# Patient Record
Sex: Male | Born: 1935 | Race: White | Hispanic: No | Marital: Married | Smoking: Former smoker
Health system: Southern US, Community
[De-identification: ages and names within clinical notes are randomized; demographics above are authoritative.]

## PROBLEM LIST (undated history)

## (undated) DIAGNOSIS — K7460 Unspecified cirrhosis of liver: Secondary | ICD-10-CM

## (undated) DIAGNOSIS — I10 Essential (primary) hypertension: Secondary | ICD-10-CM

## (undated) DIAGNOSIS — H9190 Unspecified hearing loss, unspecified ear: Secondary | ICD-10-CM

## (undated) HISTORY — DX: Unspecified cirrhosis of liver: K74.60

## (undated) HISTORY — PX: OTHER SURGICAL HISTORY: SHX169

---

## 2015-10-23 DIAGNOSIS — K5900 Constipation, unspecified: Secondary | ICD-10-CM | POA: Diagnosis not present

## 2015-10-23 DIAGNOSIS — K7460 Unspecified cirrhosis of liver: Secondary | ICD-10-CM | POA: Diagnosis not present

## 2015-10-23 DIAGNOSIS — I81 Portal vein thrombosis: Secondary | ICD-10-CM | POA: Diagnosis not present

## 2015-10-23 DIAGNOSIS — Z87891 Personal history of nicotine dependence: Secondary | ICD-10-CM | POA: Diagnosis not present

## 2015-10-23 DIAGNOSIS — R1032 Left lower quadrant pain: Secondary | ICD-10-CM | POA: Diagnosis not present

## 2015-10-23 DIAGNOSIS — Z8489 Family history of other specified conditions: Secondary | ICD-10-CM | POA: Diagnosis not present

## 2015-10-23 DIAGNOSIS — K55069 Acute infarction of intestine, part and extent unspecified: Secondary | ICD-10-CM | POA: Diagnosis not present

## 2015-10-23 DIAGNOSIS — K766 Portal hypertension: Secondary | ICD-10-CM | POA: Diagnosis not present

## 2015-10-23 DIAGNOSIS — R319 Hematuria, unspecified: Secondary | ICD-10-CM | POA: Diagnosis not present

## 2015-10-23 DIAGNOSIS — K8020 Calculus of gallbladder without cholecystitis without obstruction: Secondary | ICD-10-CM | POA: Diagnosis not present

## 2015-10-23 DIAGNOSIS — I700 Atherosclerosis of aorta: Secondary | ICD-10-CM | POA: Diagnosis not present

## 2017-02-16 ENCOUNTER — Other Ambulatory Visit: Payer: Self-pay | Admitting: *Deleted

## 2017-02-16 NOTE — Patient Outreach (Signed)
[employer] telephone screen attempted,unable to leave a message I will call another day.  Eulah Pont. Myrtie Neither, MSN, [facility] Gerontological Nurse Practitioner [facility] Care Management [phone]

## 2017-02-21 ENCOUNTER — Other Ambulatory Visit: Payer: Self-pay | Admitting: *Deleted

## 2017-02-21 NOTE — Patient Outreach (Signed)
[employer] HRA #2 attempted. I spoke with pt's son who advised me his father travels a lot and I may be able to reach him tomorrow. He also tells me he was in [facility] hospital about a year ago and he thinks his father sees Dr. Karie Kirks.  I will call him in the morning.  Bobby Tyler. Myrtie Neither, MSN, [facility] Gerontological Nurse Practitioner [facility] Care Management [phone]

## 2017-02-22 ENCOUNTER — Other Ambulatory Visit: Payer: Self-pay | Admitting: *Deleted

## 2017-02-22 ENCOUNTER — Encounter: Payer: Self-pay | Admitting: *Deleted

## 2017-02-22 NOTE — Patient Outreach (Signed)
[employer] HRA completed. Mr. Perko is a very active, independent gentleman that does not see a doctor routinely. He only takes a baby ASA a day. He denies having any health problems although he reports having a blood clot in his abdomen which required hospitalization. He continues to work, driving cars for delivery.  I advised him of [facility] management services and that [facility] would like to ensure that he is getting the recommended medical services that Medicare provides. I encouraged him to schedule an appt with Dr. Dennard Schaumann to do so. I offered that I could provided an AWV to him in his home if he didn't want to schedule an OV but he said he would see Dr. Dennard Schaumann.  He has no care management needs at this time. I will send him our information for future reference.  Eulah Pont. Myrtie Neither, MSN, [facility] Gerontological Nurse Practitioner [facility] Care Management [phone]

## 2017-02-23 NOTE — Telephone Encounter (Signed)
This encounter was created in error - please disregard.

## 2017-08-23 DIAGNOSIS — K7460 Unspecified cirrhosis of liver: Secondary | ICD-10-CM

## 2017-08-23 HISTORY — DX: Unspecified cirrhosis of liver: K74.60

## 2017-09-08 ENCOUNTER — Emergency Department (HOSPITAL_COMMUNITY): Payer: Medicare HMO

## 2017-09-08 ENCOUNTER — Emergency Department (HOSPITAL_COMMUNITY)
Admission: EM | Admit: 2017-09-08 | Discharge: 2017-09-08 | Disposition: A | Discharge status: Home / Self Care | Payer: Medicare HMO | Attending: Emergency Medicine | Admitting: Emergency Medicine

## 2017-09-08 ENCOUNTER — Encounter (HOSPITAL_COMMUNITY): Payer: Self-pay | Admitting: Emergency Medicine

## 2017-09-08 ENCOUNTER — Other Ambulatory Visit: Payer: Self-pay

## 2017-09-08 DIAGNOSIS — R600 Localized edema: Secondary | ICD-10-CM | POA: Diagnosis not present

## 2017-09-08 DIAGNOSIS — K4090 Unilateral inguinal hernia, without obstruction or gangrene, not specified as recurrent: Secondary | ICD-10-CM | POA: Insufficient documentation

## 2017-09-08 DIAGNOSIS — R918 Other nonspecific abnormal finding of lung field: Secondary | ICD-10-CM | POA: Diagnosis not present

## 2017-09-08 DIAGNOSIS — Z87891 Personal history of nicotine dependence: Secondary | ICD-10-CM | POA: Insufficient documentation

## 2017-09-08 DIAGNOSIS — R188 Other ascites: Secondary | ICD-10-CM

## 2017-09-08 DIAGNOSIS — K7460 Unspecified cirrhosis of liver: Secondary | ICD-10-CM | POA: Diagnosis not present

## 2017-09-08 DIAGNOSIS — K8080 Other cholelithiasis without obstruction: Secondary | ICD-10-CM

## 2017-09-08 DIAGNOSIS — R2243 Localized swelling, mass and lump, lower limb, bilateral: Secondary | ICD-10-CM | POA: Diagnosis present

## 2017-09-08 DIAGNOSIS — K8020 Calculus of gallbladder without cholecystitis without obstruction: Secondary | ICD-10-CM | POA: Insufficient documentation

## 2017-09-08 DIAGNOSIS — R1900 Intra-abdominal and pelvic swelling, mass and lump, unspecified site: Secondary | ICD-10-CM | POA: Diagnosis not present

## 2017-09-08 LAB — URINALYSIS, ROUTINE W REFLEX MICROSCOPIC
Bacteria, UA: NONE SEEN
Bilirubin Urine: NEGATIVE
GLUCOSE, UA: NEGATIVE mg/dL
Ketones, ur: NEGATIVE mg/dL
Nitrite: NEGATIVE
PROTEIN: NEGATIVE mg/dL
Specific Gravity, Urine: 1.015 (ref 1.005–1.030)
pH: 5 (ref 5.0–8.0)

## 2017-09-08 LAB — CBC WITH DIFFERENTIAL/PLATELET
BASOS ABS: 0 10*3/uL (ref 0.0–0.1)
BASOS PCT: 1 %
EOS ABS: 0.4 10*3/uL (ref 0.0–0.7)
Eosinophils Relative: 5 %
HCT: 36.4 % — ABNORMAL LOW (ref 39.0–52.0)
HEMOGLOBIN: 12.5 g/dL — AB (ref 13.0–17.0)
Lymphocytes Relative: 29 %
Lymphs Abs: 1.9 10*3/uL (ref 0.7–4.0)
MCH: 32.7 pg (ref 26.0–34.0)
MCHC: 34.3 g/dL (ref 30.0–36.0)
MCV: 95.3 fL (ref 78.0–100.0)
Monocytes Absolute: 0.7 10*3/uL (ref 0.1–1.0)
Monocytes Relative: 11 %
NEUTROS PCT: 54 %
Neutro Abs: 3.6 10*3/uL (ref 1.7–7.7)
Platelets: 115 10*3/uL — ABNORMAL LOW (ref 150–400)
RBC: 3.82 MIL/uL — AB (ref 4.22–5.81)
RDW: 15.4 % (ref 11.5–15.5)
WBC: 6.5 10*3/uL (ref 4.0–10.5)

## 2017-09-08 LAB — COMPREHENSIVE METABOLIC PANEL
ALK PHOS: 83 U/L (ref 38–126)
ALT: 27 U/L (ref 17–63)
AST: 45 U/L — AB (ref 15–41)
Albumin: 2.8 g/dL — ABNORMAL LOW (ref 3.5–5.0)
Anion gap: 5 (ref 5–15)
BILIRUBIN TOTAL: 2.4 mg/dL — AB (ref 0.3–1.2)
BUN: 13 mg/dL (ref 6–20)
CALCIUM: 8.2 mg/dL — AB (ref 8.9–10.3)
CO2: 28 mmol/L (ref 22–32)
Chloride: 104 mmol/L (ref 101–111)
Creatinine, Ser: 1.09 mg/dL (ref 0.61–1.24)
Glucose, Bld: 110 mg/dL — ABNORMAL HIGH (ref 65–99)
Potassium: 4.3 mmol/L (ref 3.5–5.1)
Sodium: 137 mmol/L (ref 135–145)
Total Protein: 6.4 g/dL — ABNORMAL LOW (ref 6.5–8.1)

## 2017-09-08 LAB — BRAIN NATRIURETIC PEPTIDE: B Natriuretic Peptide: 129 pg/mL — ABNORMAL HIGH (ref 0.0–100.0)

## 2017-09-08 LAB — TROPONIN I

## 2017-09-08 MED ORDER — IOPAMIDOL (ISOVUE-370) INJECTION 76%
100.0000 mL | Freq: Once | INTRAVENOUS | Status: AC | PRN
  Administered 2017-09-08: 100 mL via INTRAVENOUS

## 2017-09-08 MED ORDER — SPIRONOLACTONE 25 MG PO TABS: 25.0000 mg | ORAL_TABLET | Freq: Two times a day (BID) | ORAL | 0 refills | Status: DC

## 2017-09-08 NOTE — ED Provider Notes (Signed)
[facility] EMERGENCY DEPARTMENT Provider Note   CSN: [id number] Arrival date & time: 09/08/17  1221     History   Chief Complaint Chief Complaint  Patient presents with  . Hernia    HPI Bobby Tyler is a 82 y.o. male.  HPI   He is here for evaluation of swelling in the legs present for about a week, and concern for an abdominal umbilical hernia present for many years.  He denies shortness of breath, fever, chills, nausea, vomiting or diarrhea.  He has had decreased appetite in the last several weeks.  He denies dizziness or weakness.  He states he was previously treated with an unknown anticoagulant for "blood clot in my abdomen," 1 year ago.  He did not continue the anticoagulant because it was too expensive and he started taking aspirin instead.  He is currently taking one baby aspirin each day.  There are no other known modifying factors.  History reviewed. No pertinent past medical history.  There are no active problems to display for this patient.   History reviewed. No pertinent surgical history.      Home Medications    Prior to Admission medications   Medication Sig Start Date End Date Taking? Authorizing Provider  spironolactone (ALDACTONE) 25 MG tablet Take 1 tablet (25 mg total) by mouth 2 (two) times daily. 09/08/17   Daleen Bo, MD    Family History No family history on file.  Social History Social History   Tobacco Use  . Smoking status: Former Smoker    Types: Cigarettes    Last attempt to quit: 1985    Years since quitting: 34.3  . Smokeless tobacco: Never Used  Substance Use Topics  . Alcohol use: Not Currently  . Drug use: Not Currently     Allergies   Patient has no known allergies.   Review of Systems Review of Systems  All other systems reviewed and are negative.    Physical Exam Updated Vital Signs BP (!) 163/74   Pulse (!) 49   Temp 98.5 F (36.9 C) (Oral)   Resp 16   Ht 5' 10"  (1.778 m)   Wt 97.5 kg (215 lb)    SpO2 98%   BMI 30.85 kg/m   Physical Exam  Constitutional: He is oriented to person, place, and time. He appears well-developed. No distress.  Elderly, overweight  HENT:  Head: Normocephalic and atraumatic.  Right Ear: External ear normal.  Left Ear: External ear normal.  Eyes: Pupils are equal, round, and reactive to light. Conjunctivae and EOM are normal.  Neck: Normal range of motion and phonation normal. Neck supple.  Cardiovascular: Normal rate, regular rhythm and normal heart sounds.  Pulmonary/Chest: Effort normal and breath sounds normal. No stridor. No respiratory distress. He has no wheezes. He exhibits no bony tenderness.  Abdominal: Soft. He exhibits no distension and no mass. There is no tenderness. There is no guarding.  Small easily reducible umbilical hernia.  No associated skin changes or tenderness.  Musculoskeletal: Normal range of motion. He exhibits edema (3+ lower extremity edema, extending to thighs bilaterally.).  Neurological: He is alert and oriented to person, place, and time. No cranial nerve deficit or sensory deficit. He exhibits normal muscle tone. Coordination normal.  Skin: Skin is warm, dry and intact. No rash noted. No erythema.  Psychiatric: He has a normal mood and affect. His behavior is normal. Judgment and thought content normal.  Nursing note and vitals reviewed.    ED Treatments /  Results  Labs (all labs ordered are listed, but only abnormal results are displayed) Labs Reviewed  COMPREHENSIVE METABOLIC PANEL - Abnormal; Notable for the following components:      Result Value   Glucose, Bld 110 (*)    Calcium 8.2 (*)    Total Protein 6.4 (*)    Albumin 2.8 (*)    AST 45 (*)    Total Bilirubin 2.4 (*)    All other components within normal limits  BRAIN NATRIURETIC PEPTIDE - Abnormal; Notable for the following components:   B Natriuretic Peptide 129.0 (*)    All other components within normal limits  CBC WITH DIFFERENTIAL/PLATELET -  Abnormal; Notable for the following components:   RBC 3.82 (*)    Hemoglobin 12.5 (*)    HCT 36.4 (*)    Platelets 115 (*)    All other components within normal limits  URINALYSIS, ROUTINE W REFLEX MICROSCOPIC - Abnormal; Notable for the following components:   Hgb urine dipstick SMALL (*)    Leukocytes, UA TRACE (*)    All other components within normal limits  TROPONIN I    EKG EKG Interpretation  Date/Time:  Friday Sep 08 2017 13:32:44 EDT Ventricular Rate:  55 PR Interval:    QRS Duration: 107 QT Interval:  437 QTC Calculation: 418 R Axis:   59 Text Interpretation:  Sinus rhythm Abnormal inferior Q waves Baseline wander in lead(s) V1 V2 No old tracing to compare Confirmed by Daleen Bo [phone]) on 09/08/2017 1:38:33 PM   Radiology Dg Chest 2 View  Result Date: 09/08/2017 CLINICAL DATA:  Abdominal and lower extremity swelling. EXAM: CHEST - 2 VIEW COMPARISON:  None. FINDINGS: UPPER limits normal heart size noted. Interstitial opacities bilaterally are of uncertain chronicity. There is no evidence of focal airspace disease, suspicious pulmonary nodule/mass, pleural effusion, or pneumothorax. No acute bony abnormalities are identified. IMPRESSION: Nonspecific bilateral interstitial opacities-suspect chronic. UPPER limits normal heart size. Electronically Signed   By: Margarette Canada M.D.   On: 09/08/2017 14:52   Ct Angio Abd/pel W And/or Wo Contrast  Result Date: 09/08/2017 CLINICAL DATA:  Bilateral leg swelling. Abdominal pain. Rule out ischemia. EXAM: CTA ABDOMEN AND PELVIS without AND WITH CONTRAST TECHNIQUE: Multidetector CT imaging of the abdomen and pelvis was performed using the standard protocol during bolus administration of intravenous contrast. Multiplanar reconstructed images and MIPs were obtained and reviewed to evaluate the vascular anatomy. CONTRAST:  18m ISOVUE-370 IOPAMIDOL (ISOVUE-370) INJECTION 76% COMPARISON:  CT abdomen pelvis 10/23/2015. Report only available  this time FINDINGS: VASCULAR Aorta: Mild atherosclerotic disease in the aorta without aneurysm. Mild atherosclerotic disease in the iliac arteries bilaterally which are widely patent. Celiac: Moderate to severe stenosis at the origin SMA: Mild stenosis at the origin of the SMA with diffuse atherosclerotic calcification Renals: Single renal artery bilaterally widely patent with mild atherosclerotic disease. IMA: Patent Inflow: Normal Proximal Outflow: Normal Veins: Cirrhosis of the liver. Portal vein is patent. Superior mesenteric vein is patent. Renal veins are patent bilaterally. Mild paraesophageal varices. Review of the MIP images confirms the above findings. NON-VASCULAR Lower chest: Negative for infiltrate or effusion Hepatobiliary: Advanced cirrhosis of liver with small liver with nodular contour. Left lobe is hypertrophied. Large amount of ascites throughout the abdomen and pelvis Pancreas: Negative Spleen: Normal splenic size Adrenals/Urinary Tract: Negative for renal mass or obstruction. No renal calculi. Urinary bladder normal. Stomach/Bowel: Negative for bowel obstruction. No bowel edema or mass. Lymphatic: Negative for lymphadenopathy Reproductive: Mild prostate enlargement Other: Left inguinal  hernia containing a loop of sigmoid colon and fluid. Negative for bowel edema in the hernia sac. Musculoskeletal: No acute skeletal abnormality. 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 No evidence of bowel ischemia. Negative for venous thrombosis. Mild atherosclerotic disease in the aorta and iliac arteries without significant stenosis NON-VASCULAR *Advanced cirrhosis with large amount of ascites. *Left inguinal hernia containing sigmoid colon without evidence of bowel edema. *Cholelithiasis Electronically Signed   By: Franchot Gallo M.D.   On: 09/08/2017 18:53    Procedures Procedures (including critical care time)  Medications Ordered in  ED Medications  iopamidol (ISOVUE-370) 76 % injection 100 mL (100 mLs Intravenous Contrast Given 09/08/17 1636)     Initial Impression / Assessment and Plan / ED Course  I have reviewed the triage vital signs and the nursing notes.  Pertinent labs & imaging results that were available during my care of the patient were reviewed by me and considered in my medical decision making (see chart for details).  Clinical Course as of Sep 08 2017  Fri Sep 08, 2017  1449 Information obtained from [facility] admission, July 2017.  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  He was treated and discharged on Eliquis.  He was referred to outpatient follow-up but, apparently did not.   [EW]  1450 DG Chest 2 View [EW]  5456 DG Chest 2 View [EW]  2563 Normal except elevated hemoglobin, red cells  Urinalysis, Routine w reflex microscopic(!) [EW]  1753 Normal  Troponin I [EW]  1753 Normal except elevated glucose, low calcium, low protein, low albumin, and elevated AST.  Comprehensive metabolic panel(!) [EW]  8937 Mild elevation  Brain natriuretic peptide(!) [EW]  1753 Normal except hemoglobin low 12.5  CBC with Differential(!) [EW]  1754 Nonspecific interstitial opacities, images reviewed by me.  DG Chest 2 View [EW]  [id number] Patient with vascular disease, but no evidence for acute bowel ischemia.  Cirrhosis present, with marked ascites, likely source of peripheral edema.  Additional nonvascular abnormalities include left inguinal hernia without bowel strangulation and cholelithiasis.  Images reviewed by me.  CT Angio Abd/Pel W and/or Wo Contrast [EW]    Clinical Course User Index [EW] Daleen Bo, MD     Patient Vitals for the past 24 hrs:  BP Temp Temp src Pulse Resp SpO2 Height Weight  09/08/17 1900 (!) 163/74 - - (!) 49 16 98 % - -  09/08/17 1830 (!) 149/69 - - (!) 48 15 97 % - -  09/08/17 1800 (!) 182/72 - - (!) 50 15  99 % - -  09/08/17 1530 (!) 149/70 - - (!) 54 18 98 % - -  09/08/17 1500 (!) 149/71 - - (!) 47 17 99 % - -  09/08/17 1430 (!) 155/90 - - (!) 56 17 98 % - -  09/08/17 1400 136/60 - - (!) 52 17 97 % - -  09/08/17 1241 (!) 178/79 98.5 F (36.9 C) Oral 63 18 98 % - -  09/08/17 1239 - - - - - - 5' 10"  (1.778 m) 97.5 kg (215 lb)    7:05 PM Reevaluation with update and discussion. After initial assessment and treatment, an updated evaluation reveals he is comfortable has no complaints or distress.  Findings discussed with patient.  All questions answered. Daleen Bo   Medical Decision Making: Nonspecific peripheral edema, likely chronic, without evidence for significant acute  congestive heart failure.  Nonspecific pulmonary infiltrates are present.  CRITICAL CARE- no Performed by: Daleen Bo    Final Clinical Impressions(s) / ED Diagnoses   Final diagnoses:  Cirrhosis of liver without ascites, unspecified hepatic cirrhosis type ([place])  Other ascites  Biliary calculus of other site without obstruction  Left inguinal hernia    ED Discharge Orders        Ordered    spironolactone (ALDACTONE) 25 MG tablet  2 times daily     09/08/17 2016       Daleen Bo, MD 09/08/17 2019

## 2017-09-08 NOTE — ED Triage Notes (Signed)
Bilateral lower legs swollen and possible umbilical hernia G2-5 weeks

## 2017-09-08 NOTE — Discharge Instructions (Addendum)
Swelling your abdomen, and legs appears to be from liver cirrhosis.  We are prescribing a fluid pill, to help with the swelling.  Stay on a low-salt diet if possible.  You can try to increase the protein in your diet.  It is important to follow-up with the stomach specialist (gastroenterologist) as soon as possible for further evaluation and treatment.  Additional findings on the CAT scan today showed that you have gallstones, a left inguinal hernia, and some vascular disease without bowel compromise at this time.  All of these may require evaluation and treatment at a later date.  Consider getting a primary care doctor to see for evaluation and help with further treatments.  You can always return here if needed for problems.

## 2017-09-12 ENCOUNTER — Telehealth: Payer: Self-pay

## 2017-09-12 ENCOUNTER — Telehealth: Payer: Self-pay | Admitting: Gastroenterology

## 2017-09-12 NOTE — Telephone Encounter (Signed)
PT called and said he was seen in the ED recently for belly swelling and Dr. Eulis Foster told him to call here and schedule an appt with Dr. Oneida Alar office.  Pt has never been seen here before. Per Manuela Schwartz, I told him she will forward the ED notes to Roseanne Kaufman, NP to sign off on to schedule an appt.  Pt is aware he will be contacted for an appt after that process.

## 2017-09-12 NOTE — Telephone Encounter (Signed)
Yes. He needs an urgent.

## 2017-09-12 NOTE — Telephone Encounter (Signed)
Pt called to say that he was seen in the ER and was referred to [place]. Can we accept him as a new patient?

## 2017-09-12 NOTE — Telephone Encounter (Signed)
Yes, needs urgent. See other phone note.

## 2017-09-13 ENCOUNTER — Ambulatory Visit: Payer: Medicare HMO | Admitting: Gastroenterology

## 2017-09-13 ENCOUNTER — Encounter: Payer: Self-pay | Admitting: Gastroenterology

## 2017-09-13 VITALS — BP 147/79 | HR 83 | Temp 97.8°F | Ht 70.0 in | Wt 220.6 lb

## 2017-09-13 DIAGNOSIS — K7460 Unspecified cirrhosis of liver: Secondary | ICD-10-CM

## 2017-09-13 DIAGNOSIS — R601 Generalized edema: Secondary | ICD-10-CM

## 2017-09-13 DIAGNOSIS — R188 Other ascites: Secondary | ICD-10-CM

## 2017-09-13 MED ORDER — FUROSEMIDE 20 MG PO TABS: 20.0000 mg | ORAL_TABLET | Freq: Every day | ORAL | 1 refills | Status: DC

## 2017-09-13 NOTE — Telephone Encounter (Signed)
Pt is aware of OV today with [facility] at 130

## 2017-09-13 NOTE — Telephone Encounter (Signed)
PT has appt today at 1:30 pm.

## 2017-09-13 NOTE — Progress Notes (Signed)
Primary Care Physician:  Patient, No Pcp Per  Primary Gastroenterologist:  Garfield Cornea, MD   Chief Complaint  Patient presents with  . other    cirrhosis    HPI:  Bobby Tyler is a 82 y.o. male here at the recommendation of the ED physician for further evaluation of cirrhosis and edema.  Patient complains of several week history of abdominal swelling, lower extremity edema, poor appetite.  Denies significant abdominal pain.  He noted an umbilical hernia and got concerned.  That was actually the reason he went to the emergency department.  Generally having bowel movement a couple times per day.  2 days ago his stools are loose.  No melena or rectal bleeding.  No vomiting.  Denies any difficulty swallowing.  He does not go to the doctor.  He has no PCP.  He weighed 215 pounds in the emergency department on May 17.  At our office visit he weighed 220 pounds.  Patient gives a history of having a blood clot in his abdomen a couple of years ago.  Initially he was placed on Xarelto.  He took himself off due to significant bruising and took an aspirin up until a few weeks ago.  His son suggested aspirin may be causing his swelling therefore he stopped it.  While in the emergency department he had a CTA abdomen and pelvis which showed moderate to severe stenosis of the celiac artery, mild stenosis at the origin of SMA with diffuse atherosclerotic calcification, IMA patent.  Cirrhosis of the liver, portal vein patent.  Superior mesenteric vein patent.  Mild paraesophageal varices.  Negative for infiltrate or effusion of the lower chest.  Large amount of ascites noted throughout the abdomen and pelvis.  Left inguinal hernia containing a loop of sigmoid colon and fluid.  Cholelithiasis.  Labs showed total bilirubin of 2.4, albumin 2.8, AST 45, ALT 27, alkaline phosphatase 83, BUN 13, creatinine 1.09.  BNP 129.  Hemoglobin 12.5, platelets 115,000.  He was started on Aldactone 25 mg twice daily 5 days  ago.  Current Outpatient Medications  Medication Sig Dispense Refill  . spironolactone (ALDACTONE) 25 MG tablet Take 1 tablet (25 mg total) by mouth 2 (two) times daily. 60 tablet 0   No current facility-administered medications for this visit.     Allergies as of 09/13/2017  . (No Known Allergies)    Past Medical History:  Diagnosis Date  . Cirrhosis ([place]) 08/2017    Past Surgical History:  Procedure Laterality Date  . none      Family History  Problem Relation Age of Onset  . Liver cancer Neg Hx   . Cirrhosis Neg Hx   . Colon cancer Neg Hx     Social History   Socioeconomic History  . Marital status: Married    Spouse name: Not on file  . Number of children: Not on file  . Years of education: Not on file  . Highest education level: Not on file  Occupational History  . Not on file  Social Needs  . Financial resource strain: Not on file  . Food insecurity:    Worry: Not on file    Inability: Not on file  . Transportation needs:    Medical: Not on file    Non-medical: Not on file  Tobacco Use  . Smoking status: Former Smoker    Types: Cigarettes    Last attempt to quit: 1985    Years since quitting: 34.4  . Smokeless tobacco:  Never Used  Substance and Sexual Activity  . Alcohol use: Not Currently    Comment: never used regularly/heavily  . Drug use: Not Currently  . Sexual activity: Not on file  Lifestyle  . Physical activity:    Days per week: Not on file    Minutes per session: Not on file  . Stress: Not on file  Relationships  . Social connections:    Talks on phone: Not on file    Gets together: Not on file    Attends religious service: Not on file    Active member of club or organization: Not on file    Attends meetings of clubs or organizations: Not on file    Relationship status: Not on file  . Intimate partner violence:    Fear of current or ex partner: Not on file    Emotionally abused: Not on file    Physically abused: Not on file     Forced sexual activity: Not on file  Other Topics Concern  . Not on file  Social History Narrative  . Not on file      ROS:  General: Negative for anorexia, weight loss, fever, chills, fatigue, weakness. Eyes: Negative for vision changes.  ENT: Negative for hoarseness, difficulty swallowing , nasal congestion. CV: Negative for chest pain, angina, palpitations, dyspnea on exertion, +++peripheral edema.  Respiratory: Negative for dyspnea at rest, dyspnea on exertion, cough, sputum, wheezing.  GI: See history of present illness. GU:  Negative for dysuria, hematuria, urinary incontinence, urinary frequency, nocturnal urination. +urinary hesitancy  MS: Negative for joint pain, low back pain.  Derm: Negative for rash or itching.  Neuro: Negative for weakness, abnormal sensation, seizure, frequent headaches, memory loss, confusion.  Psych: Negative for anxiety, depression, suicidal ideation, hallucinations.  Endo: Negative for unusual weight change.  Heme: Negative for bruising or bleeding. Allergy: Negative for rash or hives.    Physical Examination:  BP (!) 147/79   Pulse 83   Temp 97.8 F (36.6 C) (Oral)   Ht 5' 10"  (1.778 m)   Wt 220 lb 9.6 oz (100.1 kg)   BMI 31.65 kg/m    General: Well-nourished, well-developed in no acute distress. Accompanied by wife.  Head: Normocephalic, atraumatic.   Eyes: Conjunctiva pink, no icterus. Mouth: Oropharyngeal mucosa moist and pink , no lesions erythema or exudate. Neck: Supple without thyromegaly, masses, or lymphadenopathy.  Lungs: Clear to auscultation bilaterally.  Heart: Regular rate and rhythm, no murmurs rubs or gallops.  Abdomen: Bowel sounds are normal, distended but not tense. +umb hernia with thin overlying skin, easily reducible and nontender, no hepatosplenomegaly or masses, no abdominal bruits, no rebound or guarding.   Rectal: not performed Extremities: 3+ lower ext edema to knees and 1+ to thighs bilaterally. No clubbing  or deformities.  Neuro: Alert and oriented x 4 , grossly normal neurologically.  Skin: Warm and dry, no rash or jaundice.   Psych: Alert and cooperative, normal mood and affect.  Labs: Lab Results  Component Value Date   WBC 6.5 09/08/2017   HGB 12.5 (L) 09/08/2017   HCT 36.4 (L) 09/08/2017   MCV 95.3 09/08/2017   PLT 115 (L) 09/08/2017   Lab Results  Component Value Date   CREATININE 1.09 09/08/2017   BUN 13 09/08/2017   NA 137 09/08/2017   K 4.3 09/08/2017   CL 104 09/08/2017   CO2 28 09/08/2017   Lab Results  Component Value Date   ALT 27 09/08/2017   AST 45 (  H) 09/08/2017   ALKPHOS 83 09/08/2017   BILITOT 2.4 (H) 09/08/2017   BNP 129 on 09/08/17.  Imaging Studies: Dg Chest 2 View  Result Date: 09/08/2017 CLINICAL DATA:  Abdominal and lower extremity swelling. EXAM: CHEST - 2 VIEW COMPARISON:  None. FINDINGS: UPPER limits normal heart size noted. Interstitial opacities bilaterally are of uncertain chronicity. There is no evidence of focal airspace disease, suspicious pulmonary nodule/mass, pleural effusion, or pneumothorax. No acute bony abnormalities are identified. IMPRESSION: Nonspecific bilateral interstitial opacities-suspect chronic. UPPER limits normal heart size. Electronically Signed   By: Margarette Canada M.D.   On: 09/08/2017 14:52   Ct Angio Abd/pel W And/or Wo Contrast  Result Date: 09/08/2017 CLINICAL DATA:  Bilateral leg swelling. Abdominal pain. Rule out ischemia. EXAM: CTA ABDOMEN AND PELVIS without AND WITH CONTRAST TECHNIQUE: Multidetector CT imaging of the abdomen and pelvis was performed using the standard protocol during bolus administration of intravenous contrast. Multiplanar reconstructed images and MIPs were obtained and reviewed to evaluate the vascular anatomy. CONTRAST:  191m ISOVUE-370 IOPAMIDOL (ISOVUE-370) INJECTION 76% COMPARISON:  CT abdomen pelvis 10/23/2015. Report only available this time FINDINGS: VASCULAR Aorta: Mild atherosclerotic disease  in the aorta without aneurysm. Mild atherosclerotic disease in the iliac arteries bilaterally which are widely patent. Celiac: Moderate to severe stenosis at the origin SMA: Mild stenosis at the origin of the SMA with diffuse atherosclerotic calcification Renals: Single renal artery bilaterally widely patent with mild atherosclerotic disease. IMA: Patent Inflow: Normal Proximal Outflow: Normal Veins: Cirrhosis of the liver. Portal vein is patent. Superior mesenteric vein is patent. Renal veins are patent bilaterally. Mild paraesophageal varices. Review of the MIP images confirms the above findings. NON-VASCULAR Lower chest: Negative for infiltrate or effusion Hepatobiliary: Advanced cirrhosis of liver with small liver with nodular contour. Left lobe is hypertrophied. Large amount of ascites throughout the abdomen and pelvis Pancreas: Negative Spleen: Normal splenic size Adrenals/Urinary Tract: Negative for renal mass or obstruction. No renal calculi. Urinary bladder normal. Stomach/Bowel: Negative for bowel obstruction. No bowel edema or mass. Lymphatic: Negative for lymphadenopathy Reproductive: Mild prostate enlargement Other: Left inguinal hernia containing a loop of sigmoid colon and fluid. Negative for bowel edema in the hernia sac. Musculoskeletal: No acute skeletal abnormality. 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 No evidence of bowel ischemia. Negative for venous thrombosis. Mild atherosclerotic disease in the aorta and iliac arteries without significant stenosis NON-VASCULAR *Advanced cirrhosis with large amount of ascites. *Left inguinal hernia containing sigmoid colon without evidence of bowel edema. *Cholelithiasis Electronically Signed   By: CFranchot GalloM.D.   On: 09/08/2017 18:53

## 2017-09-13 NOTE — Patient Instructions (Addendum)
1. Continue spironolactone, you can take both tablets at the same time every morning.  Call when you need a refill. 2. Start Lasix 20 mg every morning. 3. We will check your labs in 2 weeks to make sure you are tolerating the fluid pills.  Also check other labs to determine the cause of your cirrhosis. 4. Please weigh yourself every morning, if you are not seeing a drop in your weight and improvement in your leg swelling, you need to call me. 5. We will have you come back in the office in about 3 weeks to follow-up and to make arrangements for upper endoscopy to screen for esophageal varices. 6. Limit sodium (salt) intake to 2 g (2000 mg) daily.    Two Gram Sodium Diet 2000 mg  What is Sodium? Sodium is a mineral found naturally in many foods. The most significant source of sodium in the diet is table salt, which is about 40% sodium.  Processed, convenience, and preserved foods also contain a large amount of sodium.  The body needs only 500 mg of sodium daily to function,  A normal diet provides more than enough sodium even if you do not use salt.  Why Limit Sodium? A build up of sodium in the body can cause thirst, increased blood pressure, shortness of breath, and water retention.  Decreasing sodium in the diet can reduce edema and risk of heart attack or stroke associated with high blood pressure.  Keep in mind that there are many other factors involved in these health problems.  Heredity, obesity, lack of exercise, cigarette smoking, stress and what you eat all play a role.  General Guidelines:  Do not add salt at the table or in cooking.  One teaspoon of salt contains over 2 grams of sodium.  Read food labels  Avoid processed and convenience foods  Ask your dietitian before eating any foods not dicussed in the menu planning guidelines  Consult your physician if you wish to use a salt substitute or a sodium containing medication such as antacids.  Limit milk and milk products to 16 oz (2  cups) per day.  Shopping Hints:  READ LABELS!! "Dietetic" does not necessarily mean low sodium.  Salt and other sodium ingredients are often added to foods during processing.   Menu Planning Guidelines Food Group Choose More Often Avoid  Beverages (see also the milk group All fruit juices, low-sodium, salt-free vegetables juices, low-sodium carbonated beverages Regular vegetable or tomato juices, commercially softened water used for drinking or cooking  Breads and Cereals Enriched white, wheat, rye and pumpernickel bread, hard rolls and dinner rolls; muffins, cornbread and waffles; most dry cereals, cooked cereal without added salt; unsalted crackers and breadsticks; low sodium or homemade bread crumbs Bread, rolls and crackers with salted tops; quick breads; instant hot cereals; pancakes; commercial bread stuffing; self-rising flower and biscuit mixes; regular bread crumbs or cracker crumbs  Desserts and Sweets Desserts and sweets mad with mild should be within allowance Instant pudding mixes and cake mixes  Fats Butter or margarine; vegetable oils; unsalted salad dressings, regular salad dressings limited to 1 Tbs; light, sour and heavy cream Regular salad dressings containing bacon fat, bacon bits, and salt pork; snack dips made with instant soup mixes or processed cheese; salted nuts  Fruits Most fresh, frozen and canned fruits Fruits processed with salt or sodium-containing ingredient (some dried fruits are processed with sodium sulfites        Vegetables Fresh, frozen vegetables and low- sodium canned  vegetables Regular canned vegetables, sauerkraut, pickled vegetables, and others prepared in brine; frozen vegetables in sauces; vegetables seasoned with ham, bacon or salt pork  Condiments, Sauces, Miscellaneous  Salt substitute with physician's approval; pepper, herbs, spices; vinegar, lemon or lime juice; hot pepper sauce; garlic powder, onion powder, low sodium soy sauce (1 Tbs.); low  sodium condiments (ketchup, chili sauce, mustard) in limited amounts (1 tsp.) fresh ground horseradish; unsalted tortilla chips, pretzels, potato chips, popcorn, salsa (1/4 cup) Any seasoning made with salt including garlic salt, celery salt, onion salt, and seasoned salt; sea salt, rock salt, kosher salt; meat tenderizers; monosodium glutamate; mustard, regular soy sauce, barbecue, sauce, chili sauce, teriyaki sauce, steak sauce, Worcestershire sauce, and most flavored vinegars; canned gravy and mixes; regular condiments; salted snack foods, olives, picles, relish, horseradish sauce, catsup   Food preparation: Try these seasonings Meats:    Pork Sage, onion Serve with applesauce  Chicken Poultry seasoning, thyme, parsley Serve with cranberry sauce  Lamb Curry powder, rosemary, garlic, thyme Serve with mint sauce or jelly  Veal Marjoram, basil Serve with current jelly, cranberry sauce  Beef Pepper, bay leaf Serve with dry mustard, unsalted chive butter  Fish Bay leaf, dill Serve with unsalted lemon butter, unsalted parsley butter  Vegetables:    Asparagus Lemon juice   Broccoli Lemon juice   Carrots Mustard dressing parsley, mint, nutmeg, glazed with unsalted butter and sugar   Green beans Marjoram, lemon juice, nutmeg,dill seed   Tomatoes Basil, marjoram, onion   Spice /blend for [employer]" 4 tsp ground thyme 1 tsp ground sage 3 tsp ground rosemary 4 tsp ground marjoram   Test your knowledge 1. A product that says "Salt Free" may still contain sodium. True or False 2. Garlic Powder and Hot Pepper Sauce an be used as alternative seasonings.True or False 3. Processed foods have more sodium than fresh foods.  True or False 4. Canned Vegetables have less sodium than froze True or False  WAYS TO DECREASE YOUR SODIUM INTAKE 1. Avoid the use of added salt in cooking and at the table.  Table salt (and other prepared seasonings which contain salt) is probably one of the greatest sources of  sodium in the diet.  Unsalted foods can gain flavor from the sweet, sour, and butter taste sensations of herbs and spices.  Instead of using salt for seasoning, try the following seasonings with the foods listed.  Remember: how you use them to enhance natural food flavors is limited only by your creativity... Allspice-Meat, fish, eggs, fruit, peas, red and yellow vegetables Almond Extract-Fruit baked goods Anise Seed-Sweet breads, fruit, carrots, beets, cottage cheese, cookies (tastes like licorice) Basil-Meat, fish, eggs, vegetables, rice, vegetables salads, soups, sauces Bay Leaf-Meat, fish, stews, poultry Burnet-Salad, vegetables (cucumber-like flavor) Caraway Seed-Bread, cookies, cottage cheese, meat, vegetables, cheese, rice Cardamon-Baked goods, fruit, soups Celery Powder or seed-Salads, salad dressings, sauces, meatloaf, soup, bread.Do not use  celery salt Chervil-Meats, salads, fish, eggs, vegetables, cottage cheese (parsley-like flavor) Chili Power-Meatloaf, chicken cheese, corn, eggplant, egg dishes Chives-Salads cottage cheese, egg dishes, soups, vegetables, sauces Cilantro-Salsa, casseroles Cinnamon-Baked goods, fruit, pork, lamb, chicken, carrots Cloves-Fruit, baked goods, fish, pot roast, green beans, beets, carrots Coriander-Pastry, cookies, meat, salads, cheese (lemon-orange flavor) Cumin-Meatloaf, fish,cheese, eggs, cabbage,fruit pie (caraway flavor) [employer], fruit, eggs, fish, poultry, cottage cheese, vegetables Dill Seed-Meat, cottage cheese, poultry, vegetables, fish, salads, bread Fennel Seed-Bread, cookies, apples, pork, eggs, fish, beets, cabbage, cheese, Licorice-like flavor Garlic-(buds or powder) Salads, meat, poultry, fish, bread, butter, vegetables, potatoes.Do  not  use garlic salt Ginger-Fruit, vegetables, baked goods, meat, fish, poultry Horseradish Root-Meet, vegetables, butter Lemon Juice or Extract-Vegetables, fruit, tea, baked goods, fish  salads Mace-Baked goods fruit, vegetables, fish, poultry (taste like nutmeg) Maple Extract-Syrups Marjoram-Meat, chicken, fish, vegetables, breads, green salads (taste like Sage) Mint-Tea, lamb, sherbet, vegetables, desserts, carrots, cabbage Mustard, Dry or Seed-Cheese, eggs, meats, vegetables, poultry Nutmeg-Baked goods, fruit, chicken, eggs, vegetables, desserts Onion Powder-Meat, fish, poultry, vegetables, cheese, eggs, bread, rice salads (Do not use   Onion salt) Orange Extract-Desserts, baked goods Oregano-Pasta, eggs, cheese, onions, pork, lamb, fish, chicken, vegetables, green salads Paprika-Meat, fish, poultry, eggs, cheese, vegetables Parsley Flakes-Butter, vegetables, meat fish, poultry, eggs, bread, salads (certain forms may   Contain sodium Pepper-Meat fish, poultry, vegetables, eggs Peppermint Extract-Desserts, baked goods Poppy Seed-Eggs, bread, cheese, fruit dressings, baked goods, noodles, vegetables, cottage  [employer], poultry, meat, fish, cauliflower, turnips,eggs bread Saffron-Rice, bread, veal, chicken, fish, eggs Sage-Meat, fish, poultry, onions, eggplant, tomateos, pork, stews Savory-Eggs, salads, poultry, meat, rice, vegetables, soups, pork Tarragon-Meat, poultry, fish, eggs, butter, vegetables (licorice-like flavor)  Thyme-Meat, poultry, fish, eggs, vegetables, (clover-like flavor), sauces, soups Tumeric-Salads, butter, eggs, fish, rice, vegetables (saffron-like flavor) Vanilla Extract-Baked goods, candy Vinegar-Salads, vegetables, meat marinades Walnut Extract-baked goods, candy  2. Choose your Foods Wisely   The following is a list of foods to avoid which are high in sodium:  Meats-Avoid all smoked, canned, salt cured, dried and kosher meat and fish as well as Anchovies   [employer] meats:Bologna, Liverwurst, Pastrami Canned meat or fish  Marinated herring Caviar    Pepperoni Corned  Beef   Pizza Dried chipped beef  Salami Frozen breaded fish or meat Salt pork Frankfurters or hot dogs  Sardines Gefilte fish   Sausage Ham (boiled ham, Proscuitto Smoked butt    spiced ham)   Spam      TV Dinners Vegetables Canned vegetables (Regular) Relish Canned mushrooms  Sauerkraut Olives    Tomato juice Pickles  Bakery and Dessert Products Canned puddings  Cream pies Cheesecake   Decorated cakes Cookies  Beverages/Juices Tomato juice, regular  Gatorade   V-8 vegetable juice, regular  Breads and Cereals Biscuit mixes   Salted potato chips, corn chips, pretzels Bread stuffing mixes  Salted crackers and rolls Pancake and waffle mixes Self-rising flour  Seasonings Accent    Meat sauces Barbecue sauce  Meat tenderizer Catsup    Monosodium glutamate (MSG) Celery salt   Onion salt Chili sauce   Prepared mustard Garlic salt   Salt, seasoned salt, sea salt Gravy mixes   Soy sauce Horseradish   Steak sauce Ketchup   Tartar sauce Lite salt    Teriyaki sauce Marinade mixes   Worcestershire sauce  Others Baking powder   Cocoa and cocoa mixes Baking soda   Commercial casserole mixes Candy-caramels, chocolate  Dehydrated soups    Bars, fudge,nougats  Instant rice and pasta mixes Canned broth or soup  Maraschino cherries Cheese, aged and processed cheese and cheese spreads

## 2017-09-14 NOTE — Progress Notes (Signed)
cc'ed to pcp °

## 2017-09-14 NOTE — Assessment & Plan Note (Signed)
Very pleasant 82 year old gentleman with several week history of abdominal swelling, lower extremity edema who presented to the emergency department for further evaluation.  CT imaging revealed cirrhosis/ascites.  His albumin was 2.8.  Patient reports having a blood clot in his abdomen in 2017, took anticoagulation for short period of time.  None seen on current imaging.  We are trying to get a copy of previous CT report done at [facility].  Etiology of cirrhosis unclear.  Will begin labs to determine.  He is low risk for viral hepatitis, only potential risk factor is tattoo.  We discussed cirrhosis care briefly today.  He is aware that he will need to maintain relationship with [place].  We discussed hepatoma surveillance, periodic labs, need for EGD with possible esophageal variceal banding.  In the immediate future, we will work towards fluid management.  We discussed 2 g sodium diet and handout provided.  Will add Lasix 20 mg daily along with taking Aldactone 50 mg daily.  Repeat labs in a couple weeks and if well-tolerated we may go up on dosage.   We will have the patient come back in 3 weeks for evaluation.  In the interim he will weigh himself at home and if no notable weight loss or improvement in his lower extremity edema/ascites he will let us know.

## 2017-09-15 ENCOUNTER — Ambulatory Visit: Payer: Medicare HMO | Admitting: Gastroenterology

## 2017-09-26 ENCOUNTER — Telehealth: Payer: Self-pay | Admitting: Gastroenterology

## 2017-09-26 NOTE — Telephone Encounter (Signed)
PATIENT CALLED AND WANTED TO KNOW IF HE NEEDS TO CONTINUE HIS MEDICATION, STATED HIS WEIGHT HAS GONE DOWN SIGNIFICANTLY

## 2017-09-26 NOTE — Telephone Encounter (Signed)
Forwarding to [place], this is RMR pt.

## 2017-09-26 NOTE — Telephone Encounter (Signed)
Spoke with pt and he would like to know if he needs to continue Lasix. His legs and feet have gone down and he feels great since he started Lasix. Pts starting weight was 220.9 and his current weight is 191.8. Please advise.

## 2017-09-27 NOTE — Telephone Encounter (Signed)
Please have him continue the lasix and aldactone until he has his labs done. He can go any day now to have them done. Once his labs are drawn, he can stop the fluid pills.   He should continue to monitor his weight and for swelling in the legs. If recurrent then he will need to go back on both aldactone and lasix.

## 2017-09-27 NOTE — Telephone Encounter (Signed)
Pt notified and will have lab work done.

## 2017-10-03 DIAGNOSIS — K7460 Unspecified cirrhosis of liver: Secondary | ICD-10-CM | POA: Diagnosis not present

## 2017-10-03 DIAGNOSIS — R601 Generalized edema: Secondary | ICD-10-CM | POA: Diagnosis not present

## 2017-10-03 DIAGNOSIS — R188 Other ascites: Secondary | ICD-10-CM | POA: Diagnosis not present

## 2017-10-04 LAB — HEPATITIS A ANTIBODY, TOTAL: Hepatitis A AB,Total: NONREACTIVE

## 2017-10-04 LAB — CBC WITH DIFFERENTIAL/PLATELET
BASOS PCT: 0.7 %
Basophils Absolute: 66 cells/uL (ref 0–200)
EOS ABS: 263 {cells}/uL (ref 15–500)
Eosinophils Relative: 2.8 %
HEMATOCRIT: 37.1 % — AB (ref 38.5–50.0)
Hemoglobin: 13.3 g/dL (ref 13.2–17.1)
LYMPHS ABS: 2190 {cells}/uL (ref 850–3900)
MCH: 32.1 pg (ref 27.0–33.0)
MCHC: 35.8 g/dL (ref 32.0–36.0)
MCV: 89.6 fL (ref 80.0–100.0)
MPV: 12.8 fL — AB (ref 7.5–12.5)
Monocytes Relative: 11.1 %
NEUTROS PCT: 62.1 %
Neutro Abs: 5837 cells/uL (ref 1500–7800)
Platelets: 136 10*3/uL — ABNORMAL LOW (ref 140–400)
RBC: 4.14 10*6/uL — AB (ref 4.20–5.80)
RDW: 13.9 % (ref 11.0–15.0)
TOTAL LYMPHOCYTE: 23.3 %
WBC mixed population: 1043 cells/uL — ABNORMAL HIGH (ref 200–950)
WBC: 9.4 10*3/uL (ref 3.8–10.8)

## 2017-10-04 LAB — COMPREHENSIVE METABOLIC PANEL
AG Ratio: 0.8 (calc) — ABNORMAL LOW (ref 1.0–2.5)
ALBUMIN MSPROF: 3.4 g/dL — AB (ref 3.6–5.1)
ALKALINE PHOSPHATASE (APISO): 125 U/L — AB (ref 40–115)
ALT: 38 U/L (ref 9–46)
AST: 51 U/L — AB (ref 10–35)
BUN/Creatinine Ratio: 16 (calc) (ref 6–22)
BUN: 22 mg/dL (ref 7–25)
CO2: 29 mmol/L (ref 20–32)
Calcium: 9.3 mg/dL (ref 8.6–10.3)
Chloride: 99 mmol/L (ref 98–110)
Creat: 1.41 mg/dL — ABNORMAL HIGH (ref 0.70–1.11)
Globulin: 4.3 g/dL (calc) — ABNORMAL HIGH (ref 1.9–3.7)
Glucose, Bld: 105 mg/dL — ABNORMAL HIGH (ref 65–99)
Potassium: 5.2 mmol/L (ref 3.5–5.3)
Sodium: 135 mmol/L (ref 135–146)
Total Bilirubin: 2.6 mg/dL — ABNORMAL HIGH (ref 0.2–1.2)
Total Protein: 7.7 g/dL (ref 6.1–8.1)

## 2017-10-04 LAB — IRON,TIBC AND FERRITIN PANEL
%SAT: 91 % (calc) — ABNORMAL HIGH (ref 15–60)
Ferritin: 596 ng/mL — ABNORMAL HIGH (ref 20–380)
Iron: 170 ug/dL (ref 50–180)
TIBC: 187 ug/dL — AB (ref 250–425)

## 2017-10-04 LAB — HEPATITIS B SURFACE ANTIBODY,QUALITATIVE: Hep B S Ab: NONREACTIVE

## 2017-10-04 LAB — PROTIME-INR
INR: 1.3 — AB
PROTHROMBIN TIME: 13.4 s — AB (ref 9.0–11.5)

## 2017-10-04 LAB — AFP TUMOR MARKER: AFP TUMOR MARKER: 3 ng/mL (ref <6.1)

## 2017-10-04 LAB — HEPATITIS C ANTIBODY
Hepatitis C Ab: NONREACTIVE
SIGNAL TO CUT-OFF: 0.18 (ref <1.00)

## 2017-10-04 LAB — HEPATITIS B CORE ANTIBODY, TOTAL: HEP B C TOTAL AB: NONREACTIVE

## 2017-10-04 LAB — HEPATITIS B SURFACE ANTIGEN: HEP B S AG: NONREACTIVE

## 2017-10-06 ENCOUNTER — Other Ambulatory Visit: Payer: Self-pay

## 2017-10-06 DIAGNOSIS — K7460 Unspecified cirrhosis of liver: Secondary | ICD-10-CM

## 2017-10-06 NOTE — Progress Notes (Signed)
Labs reviewed in provider's absence. Had been started on low dose diuretics at visit and continues on this. Most recent dose this morning, 6/14. He has no lower extremity edema, no abdominal distension, following 2 gram sodium diet, "feels great". I have asked him to stop the diuretics. I told him we would recheck renal function next week, and I expect creatinine to be back to baseline. 1.41 currently, slightly increased from baseline. Alicia: please have patient complete BMP early next week. Magda Paganini to provide further recommendations when she returns. He can be reached on cell phone#: [phone] as well.

## 2017-10-10 NOTE — Progress Notes (Signed)
I added hemochromatosis labs. Please make sure patient is aware that both BMP and hemochromatosis labs need to be done.

## 2017-10-10 NOTE — Progress Notes (Signed)
Noted, spoke with pt and he's aware.

## 2017-10-12 DIAGNOSIS — K7460 Unspecified cirrhosis of liver: Secondary | ICD-10-CM | POA: Diagnosis not present

## 2017-10-12 LAB — BASIC METABOLIC PANEL
BUN / CREAT RATIO: 17 (calc) (ref 6–22)
BUN: 21 mg/dL (ref 7–25)
CHLORIDE: 105 mmol/L (ref 98–110)
CO2: 25 mmol/L (ref 20–32)
CREATININE: 1.21 mg/dL — AB (ref 0.70–1.11)
Calcium: 8.6 mg/dL (ref 8.6–10.3)
GLUCOSE: 119 mg/dL (ref 65–139)
Potassium: 4.4 mmol/L (ref 3.5–5.3)
Sodium: 135 mmol/L (ref 135–146)

## 2017-10-13 NOTE — Progress Notes (Signed)
Renal function improved, diuretics discontinued as of last week. Leslie's patient. Routing to her as FYI for any further recommendations. Please copy this to PCP as well.

## 2017-10-28 ENCOUNTER — Other Ambulatory Visit: Payer: Self-pay

## 2017-10-28 ENCOUNTER — Emergency Department (HOSPITAL_COMMUNITY)
Admission: EM | Admit: 2017-10-28 | Discharge: 2017-10-28 | Disposition: A | Discharge status: Home / Self Care | Payer: Medicare HMO | Attending: Emergency Medicine | Admitting: Emergency Medicine

## 2017-10-28 ENCOUNTER — Encounter (HOSPITAL_COMMUNITY): Payer: Self-pay | Admitting: Emergency Medicine

## 2017-10-28 DIAGNOSIS — R2243 Localized swelling, mass and lump, lower limb, bilateral: Secondary | ICD-10-CM | POA: Insufficient documentation

## 2017-10-28 DIAGNOSIS — R601 Generalized edema: Secondary | ICD-10-CM

## 2017-10-28 DIAGNOSIS — R188 Other ascites: Secondary | ICD-10-CM | POA: Diagnosis not present

## 2017-10-28 DIAGNOSIS — N4889 Other specified disorders of penis: Secondary | ICD-10-CM | POA: Diagnosis present

## 2017-10-28 DIAGNOSIS — R300 Dysuria: Secondary | ICD-10-CM | POA: Diagnosis not present

## 2017-10-28 DIAGNOSIS — Z87891 Personal history of nicotine dependence: Secondary | ICD-10-CM | POA: Diagnosis not present

## 2017-10-28 LAB — COMPREHENSIVE METABOLIC PANEL
ALBUMIN: 2.7 g/dL — AB (ref 3.5–5.0)
ALT: 37 U/L (ref 0–44)
AST: 56 U/L — ABNORMAL HIGH (ref 15–41)
Alkaline Phosphatase: 113 U/L (ref 38–126)
Anion gap: 4 — ABNORMAL LOW (ref 5–15)
BUN: 17 mg/dL (ref 8–23)
CALCIUM: 8.4 mg/dL — AB (ref 8.9–10.3)
CHLORIDE: 104 mmol/L (ref 98–111)
CO2: 28 mmol/L (ref 22–32)
Creatinine, Ser: 1.16 mg/dL (ref 0.61–1.24)
GFR calc Af Amer: >60 mL/min (ref >60)
GFR calc non Af Amer: 58 mL/min — ABNORMAL LOW (ref >60)
GLUCOSE: 103 mg/dL — AB (ref 70–99)
POTASSIUM: 4.6 mmol/L (ref 3.5–5.1)
SODIUM: 136 mmol/L (ref 135–145)
Total Bilirubin: 1.7 mg/dL — ABNORMAL HIGH (ref 0.3–1.2)
Total Protein: 6.8 g/dL (ref 6.5–8.1)

## 2017-10-28 LAB — URINALYSIS, ROUTINE W REFLEX MICROSCOPIC
Bilirubin Urine: NEGATIVE
Glucose, UA: NEGATIVE mg/dL
Hgb urine dipstick: NEGATIVE
Ketones, ur: NEGATIVE mg/dL
Leukocytes, UA: NEGATIVE
NITRITE: NEGATIVE
PROTEIN: NEGATIVE mg/dL
SPECIFIC GRAVITY, URINE: 1.017 (ref 1.005–1.030)
pH: 6 (ref 5.0–8.0)

## 2017-10-28 MED ORDER — FUROSEMIDE 20 MG PO TABS: 20.0000 mg | ORAL_TABLET | Freq: Every day | ORAL | 0 refills | Status: DC

## 2017-10-28 MED ORDER — FUROSEMIDE 40 MG PO TABS
40.0000 mg | ORAL_TABLET | Freq: Once | ORAL | Status: AC
  Administered 2017-10-28: 40 mg via ORAL
  Filled 2017-10-28: qty 1

## 2017-10-28 MED ORDER — SPIRONOLACTONE 25 MG PO TABS: 25.0000 mg | ORAL_TABLET | Freq: Two times a day (BID) | ORAL | 0 refills | Status: DC

## 2017-10-28 NOTE — ED Notes (Signed)
Bladder scan: 40m

## 2017-10-28 NOTE — Progress Notes (Signed)
CT A/P with CM 09/2015 showed acute thrombosis of SMV with mild surrounding edema in the central mesentery. Evidence of cirrhosis. Normal spleen.

## 2017-10-28 NOTE — ED Triage Notes (Signed)
Pt reports groin swelling and pain, decreased urination x 4-5 days

## 2017-10-28 NOTE — ED Provider Notes (Signed)
[facility] EMERGENCY DEPARTMENT Provider Note   CSN: [id number] Arrival date & time: 10/28/17  0026     History   Chief Complaint Chief Complaint  Patient presents with  . Groin Swelling     Pt seen with NP student, I performed history/physical/documentation  HPI Bobby Tyler is a 82 y.o. male.  The history is provided by the patient and the spouse.  Illness  This is a new problem. The current episode started more than 2 days ago. The problem occurs daily. The problem has been gradually worsening. Pertinent negatives include no chest pain and no abdominal pain. Nothing aggravates the symptoms. Nothing relieves the symptoms.  Patient with history of cirrhosis presents with abdominal swelling as well as scrotal swelling.  This is been ongoing for several days.  He denies any fever/vomiting/abdominal pain.  There is no pain in the scrotum.  He reports it is difficult to urinate due to the swelling around his penis. He recently was taken off of his diuretics, but did take doses in the past 24 hours  Past Medical History:  Diagnosis Date  . Cirrhosis ([place]) 08/2017    Patient Active Problem List   Diagnosis Date Noted  . Cirrhosis ([place]) 09/13/2017    Past Surgical History:  Procedure Laterality Date  . none          Home Medications    Prior to Admission medications   Medication Sig Start Date End Date Taking? Authorizing Provider  furosemide (LASIX) 20 MG tablet Take 1 tablet (20 mg total) by mouth daily. 10/28/17   Ripley Fraise, MD  spironolactone (ALDACTONE) 25 MG tablet Take 1 tablet (25 mg total) by mouth 2 (two) times daily. 10/28/17   Ripley Fraise, MD    Family History Family History  Problem Relation Age of Onset  . Liver cancer Neg Hx   . Cirrhosis Neg Hx   . Colon cancer Neg Hx     Social History Social History   Tobacco Use  . Smoking status: Former Smoker    Types: Cigarettes    Last attempt to quit: 1985    Years since quitting: 34.5    . Smokeless tobacco: Never Used  Substance Use Topics  . Alcohol use: Not Currently    Comment: never used regularly/heavily  . Drug use: Not Currently     Allergies   Patient has no known allergies.   Review of Systems Review of Systems  Constitutional: Negative for fever.  Cardiovascular: Positive for leg swelling. Negative for chest pain.  Gastrointestinal: Negative for abdominal pain.  Genitourinary: Positive for difficulty urinating and scrotal swelling.  All other systems reviewed and are negative.    Physical Exam Updated Vital Signs BP (!) 146/68   Pulse 61   Temp 98.4 F (36.9 C) (Oral)   Resp (!) 22   Ht 1.778 m (5' 10" )   Wt 88.5 kg (195 lb)   SpO2 99%   BMI 27.98 kg/m   Physical Exam CONSTITUTIONAL: elderly, no acute distress HEAD: Normocephalic/atraumatic EYES: EOMI ENMT: Mucous membranes moist NECK: supple no meningeal signs SPINE/BACK:entire spine nontender CV: S1/S2 noted, no murmurs/rubs/gallops noted LUNGS: Lungs are clear to auscultation bilaterally, no apparent distress ABDOMEN: soft, nontender, no rebound or guarding, bowel sounds noted throughout abdomen, distended but nontender GU: Significant edema noted throughout scrotum and penis.  Pitting edema is noted.  There is no erythema, no crepitus, no tenderness.  No abscess noted, no perineal tenderness Chaperone present for exam NEURO: Pt  is awake/alert/appropriate, moves all extremitiesx4.  No facial droop.   EXTREMITIES: pulses normal/equal, full ROM, symmetric pitting edema to bilateral lower extremities SKIN: warm, color normal PSYCH: no abnormalities of mood noted, alert and oriented to situation   ED Treatments / Results  Labs (all labs ordered are listed, but only abnormal results are displayed) Labs Reviewed  COMPREHENSIVE METABOLIC PANEL - Abnormal; Notable for the following components:      Result Value   Glucose, Bld 103 (*)    Calcium 8.4 (*)    Albumin 2.7 (*)    AST 56  (*)    Total Bilirubin 1.7 (*)    GFR calc non Af Amer 58 (*)    Anion gap 4 (*)    All other components within normal limits  URINE CULTURE  URINALYSIS, ROUTINE W REFLEX MICROSCOPIC    EKG None  Radiology No results found.  Procedures Procedures    Medications Ordered in ED Medications  furosemide (LASIX) tablet 40 mg (40 mg Oral Given 10/28/17 0447)     Initial Impression / Assessment and Plan / ED Course  I have reviewed the triage vital signs and the nursing notes.  Pertinent labs  results that were available during my care of the patient were reviewed by me and considered in my medical decision making (see chart for details).     Patient with history of cirrhosis, presents with anasarca, pitting edema to his scrotum as well as edema to his legs.  He recently had good response to diuretics, but has been taken off of them.  I suspect that he has recurrent ascites and now has fluid expanding into his scrotum.  There is no signs of any infectious etiology.  Due to the massive edema, he is having difficulty urinating. Due to difficulty urinating, nursing staff placed a Foley. Tolerated this well.  This was transition to a leg bag for now.  I have re-prescribed him his daily diuretics.  He should follow-up with GI next week. Extensive discussion with patient and wife.  Labs are reassuring.  Final Clinical Impressions(s) / ED Diagnoses   Final diagnoses:  Anasarca  Other ascites    ED Discharge Orders        Ordered    furosemide (LASIX) 20 MG tablet  Daily     10/28/17 0428    spironolactone (ALDACTONE) 25 MG tablet  2 times daily     10/28/17 0428       Ripley Fraise, MD 10/28/17 [phone]

## 2017-10-28 NOTE — ED Notes (Signed)
Discussed with pt and wife peri care and foley care, instructed and demonstrated with return demonstration changing of foley drainage bag to leg bag, attaching, securing and emptying of bag, pt and wife verbalized understanding

## 2017-10-29 LAB — URINE CULTURE: CULTURE: NO GROWTH

## 2017-10-30 ENCOUNTER — Ambulatory Visit: Payer: Medicare HMO | Admitting: Gastroenterology

## 2017-10-30 ENCOUNTER — Ambulatory Visit (HOSPITAL_COMMUNITY)
Admission: RE | Admit: 2017-10-30 | Discharge: 2017-10-30 | Disposition: A | Discharge status: Home / Self Care | Payer: Medicare HMO | Source: Ambulatory Visit | Attending: Gastroenterology | Admitting: Gastroenterology

## 2017-10-30 ENCOUNTER — Other Ambulatory Visit: Payer: Self-pay | Admitting: Gastroenterology

## 2017-10-30 ENCOUNTER — Telehealth: Payer: Self-pay

## 2017-10-30 ENCOUNTER — Encounter: Payer: Self-pay | Admitting: Gastroenterology

## 2017-10-30 VITALS — BP 152/62 | HR 58 | Temp 97.0°F | Ht 70.0 in | Wt 195.0 lb

## 2017-10-30 DIAGNOSIS — K7460 Unspecified cirrhosis of liver: Secondary | ICD-10-CM | POA: Diagnosis not present

## 2017-10-30 DIAGNOSIS — N5089 Other specified disorders of the male genital organs: Secondary | ICD-10-CM | POA: Diagnosis not present

## 2017-10-30 DIAGNOSIS — N433 Hydrocele, unspecified: Secondary | ICD-10-CM | POA: Diagnosis not present

## 2017-10-30 DIAGNOSIS — N432 Other hydrocele: Secondary | ICD-10-CM | POA: Diagnosis not present

## 2017-10-30 MED ORDER — SULFAMETHOXAZOLE-TRIMETHOPRIM 800-160 MG PO TABS: 1.0000 | ORAL_TABLET | Freq: Two times a day (BID) | ORAL | 0 refills | Status: AC

## 2017-10-30 NOTE — Assessment & Plan Note (Addendum)
82 year old male initially presenting to GI as a new patient in May 2019 with ascites and anasarca, responding well with diuretic regimen in May/June. Renal function mildly declined and diuretics were on hold, without recurrence of ascites or significant lower extremity edema. 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 He does have an elevated ferritin and sats, so hemachromatosis needs to be ruled out. Presented to ED with scrotal edema two days ago. From what I gather in talking with him and from clinical presentation today, this appears isolated. He clinically has no abdominal ascites and lower extremity edema is mild and controlled. Known left inguinal hernia from prior imaging. Scrotal edema needs to be further evaluated to rule out other cause, as he does not appear to have significant anasarca and is much improved from last evaluation in May 2019.   Scrotal ultrasound with doppler today Continue low-dose diuretics for now and repeat BMP on Thursday Return to clinic Thursday at noon to reassess scrotal edema and remove foley that ED placed: he has no PCP.  BMP, hemochromatosis DNA ordered for Thursday Strict 2 gram sodium diet Will arrange EGD for variceal screening when patient seen again Needs Hep A and B vaccinations: will give prescription once established with a PCP Refer to [facility] clinic, as he was a previous patient there

## 2017-10-30 NOTE — Progress Notes (Signed)
Referring Provider: No ref. provider found Primary Care Physician:  Patient, No Pcp Per  Primary GI:Dr. Rourk   Chief Complaint  Patient presents with  . Cirrhosis    went to ER 10/28/17    HPI:   Bobby Tyler is an 82 y.o. male presenting today with a history of cirrhosis, thorough labs thus far with elevated ferritin and sats. Needs hemochromatosis genetic markers. Needs Hep A and B vaccinations. Will need screening EGD in near future. Diuretic therapy was held in setting of decreasing renal function. Presented to the ED with new onset scrotal edema and restarted on diuretic therapy. He weighed 220 in May 2019. Today 195, similar to ED presentation.    States he has no abdominal distension, no lower extremity edema. Notes he had a slow, progressive swelling of scrotal area noted over time. Unable to quantify. Woke up one morning concerned and went to ED. Trying to limit salt but still drinking V8 low sodium. No added salt. No abdominal pain, confusion, mental status changes.   Left groin discomfort with standing. Discomfort in left testicle with standing.   Past Medical History:  Diagnosis Date  . Cirrhosis ([place]) 08/2017    Past Surgical History:  Procedure Laterality Date  . none      Current Outpatient Medications  Medication Sig Dispense Refill  . furosemide (LASIX) 20 MG tablet Take 1 tablet (20 mg total) by mouth daily. 10 tablet 0  . spironolactone (ALDACTONE) 25 MG tablet Take 1 tablet (25 mg total) by mouth 2 (two) times daily. 20 tablet 0   No current facility-administered medications for this visit.     Allergies as of 10/30/2017  . (No Known Allergies)    Family History  Problem Relation Age of Onset  . Liver cancer Neg Hx   . Cirrhosis Neg Hx   . Colon cancer Neg Hx     Social History   Socioeconomic History  . Marital status: Married    Spouse name: Not on file  . Number of children: Not on file  . Years of education: Not on file  . Highest  education level: Not on file  Occupational History  . Not on file  Social Needs  . Financial resource strain: Not on file  . Food insecurity:    Worry: Not on file    Inability: Not on file  . Transportation needs:    Medical: Not on file    Non-medical: Not on file  Tobacco Use  . Smoking status: Former Smoker    Types: Cigarettes    Last attempt to quit: 1985    Years since quitting: 34.5  . Smokeless tobacco: Never Used  Substance and Sexual Activity  . Alcohol use: Not Currently    Comment: never used regularly/heavily  . Drug use: Not Currently  . Sexual activity: Not on file  Lifestyle  . Physical activity:    Days per week: Not on file    Minutes per session: Not on file  . Stress: Not on file  Relationships  . Social connections:    Talks on phone: Not on file    Gets together: Not on file    Attends religious service: Not on file    Active member of club or organization: Not on file    Attends meetings of clubs or organizations: Not on file    Relationship status: Not on file  Other Topics Concern  . Not on file  Social History Narrative  .  Not on file    Review of Systems: As mentioned in HPI   Physical Exam: BP (!) 152/62   Pulse (!) 58   Temp (!) 97 F (36.1 C) (Oral)   Ht 5' 10"  (1.778 m)   Wt 195 lb (88.5 kg)   BMI 27.98 kg/m  General:   Alert and oriented. No distress noted. Pleasant and cooperative.  Head:  Normocephalic and atraumatic. Eyes:  Conjuctiva clear without scleral icterus. Mouth:  Oral mucosa pink and moist.  Abdomen:  +BS, soft, non-tender and non-distended. No rebound or guarding. Small umbilical hernia. Scrotum edematous, more tender to left side of scrotum, approximately the size of a small cantaloupe. Patient believes this has gotten slightly smaller. Foley in place. Penis is visible and not protracted despite edema.  Msk:  Symmetrical without gross deformities. Normal posture. Extremities:  1+ lower extremity edema. No flank  or upper thigh pitting edema.  Neurologic:  Alert and  oriented x4 Psych:  Alert and cooperative. Normal mood and affect.

## 2017-10-30 NOTE — Progress Notes (Signed)
The left epididymis is enlarged compared to the right, with moderate left hydrocele. We need to have him come back to clinic Tuesday and have the catheter removed that was placed in the ED. Also need to start empiric antibiotic coverage for possible epididymitis. He was not systemically ill and can be treated as outpatient. Needs referral to Urology as well. Finally, we will order a urinalysis and culture to be completed after catheter is removed. I am sending in Bactrim DS BID for 10 days to his pharmacy. Patient is aware. He is coming to clinic at noon tomorrow to have foley removed.  [facility] clinical pool: please refer to Urology ASAP.

## 2017-10-30 NOTE — Telephone Encounter (Signed)
PT called and Rosendo Gros scheduled him to see Roseanne Kaufman, NP this afternoon.

## 2017-10-30 NOTE — Telephone Encounter (Signed)
[facility] and spoke to [place], pt doesn't need referral. Gave her pt's phone numbers. She will call him tomorrow to schedule an appt.

## 2017-10-30 NOTE — Patient Instructions (Signed)
I have ordered blood work to have done on Thursday when you are in town again. Please come to our office Thursday at noon so we can check the catheter again and remove it.   I have ordered an ultrasound of your scrotum for today.  We will talk about arranging upper endoscopy on Thursday!  We are referring you to a primary care office   It was a pleasure to see you today. I strive to create trusting relationships with patients to provide genuine, compassionate, and quality care. I value your feedback. If you receive a survey regarding your visit,  I greatly appreciate you taking time to fill this out.   Annitta Needs, PhD, ANP-BC [facility]

## 2017-10-31 ENCOUNTER — Other Ambulatory Visit: Payer: Self-pay

## 2017-10-31 DIAGNOSIS — N5089 Other specified disorders of the male genital organs: Secondary | ICD-10-CM

## 2017-10-31 NOTE — Progress Notes (Signed)
I was able to reach wife. They are aware to not come today. Will need BMP on Thursday or Friday.

## 2017-10-31 NOTE — Progress Notes (Signed)
No pcp per patient 

## 2017-10-31 NOTE — Progress Notes (Signed)
After further review of chart: he has a recent UA and culture from when seen in the ED. No need to repeat this. Although having a foley in place increases risk of infection, we are already covering with antibiotics, and I do not want to remove this foley in clinic and risk having him unable to void at home and then be unable to replace foley as outpatient. Would be best served by seeing Urology ASAP. I had told him to come to clinic today at noon to have foley removed. PLEASE TELL HIM HE DOES NOT NEED TO COME. He can stay at home , and we will get him in to see Urology ASAP. Also: we definitely need to recheck BMP on Thursday regardless.  [facility] clinical pool: please see when the appt will be with [facility]. May need to go to [facility] if that is quicker. He does not have a PCP, and we do not manage this.

## 2017-11-02 DIAGNOSIS — N451 Epididymitis: Secondary | ICD-10-CM | POA: Diagnosis not present

## 2017-11-02 DIAGNOSIS — N5089 Other specified disorders of the male genital organs: Secondary | ICD-10-CM | POA: Diagnosis not present

## 2017-11-06 NOTE — Progress Notes (Signed)
Pt said he forgot to do the lab. Michela Pitcher he will go to [employer] to do it. Forwarding to Manuela Schwartz to get the notes from [facility].

## 2017-11-06 NOTE — Progress Notes (Signed)
We need notes from [facility]. Also need BMP, which was supposed to be done last week.

## 2017-11-07 DIAGNOSIS — K7460 Unspecified cirrhosis of liver: Secondary | ICD-10-CM | POA: Diagnosis not present

## 2017-11-08 ENCOUNTER — Ambulatory Visit: Payer: Medicare HMO | Admitting: Gastroenterology

## 2017-11-08 DIAGNOSIS — N5089 Other specified disorders of the male genital organs: Secondary | ICD-10-CM | POA: Diagnosis not present

## 2017-11-08 NOTE — Progress Notes (Signed)
I spoke with patient. Potassium is mildly increased. Creatinine has bumped. He has NOT been taking fluid pills. Stop Bactrim (last dose would be tomorrow). He tells me urology is sending him back to [place]. We need urology notes ASAP to know what they suggested. I have asked him to ONLY take lasix 20 mg today and tomorrow. Needs BMP on Friday, ordered as stat. He is aware to go to the lab on Friday.

## 2017-11-09 ENCOUNTER — Other Ambulatory Visit: Payer: Self-pay

## 2017-11-09 DIAGNOSIS — K7460 Unspecified cirrhosis of liver: Secondary | ICD-10-CM

## 2017-11-09 NOTE — Progress Notes (Signed)
Lab order placed as STAT for Friday and released.

## 2017-11-10 DIAGNOSIS — K7460 Unspecified cirrhosis of liver: Secondary | ICD-10-CM | POA: Diagnosis not present

## 2017-11-10 LAB — BASIC METABOLIC PANEL
BUN / CREAT RATIO: 15 (calc) (ref 6–22)
BUN: 20 mg/dL (ref 7–25)
CALCIUM: 8.6 mg/dL (ref 8.6–10.3)
CHLORIDE: 102 mmol/L (ref 98–110)
CO2: 27 mmol/L (ref 20–32)
Creat: 1.34 mg/dL — ABNORMAL HIGH (ref 0.70–1.11)
GLUCOSE: 97 mg/dL (ref 65–139)
Potassium: 4.5 mmol/L (ref 3.5–5.3)
Sodium: 133 mmol/L — ABNORMAL LOW (ref 135–146)

## 2017-11-11 LAB — BASIC METABOLIC PANEL WITH GFR
BUN / CREAT RATIO: 11 (calc) (ref 6–22)
BUN: 17 mg/dL (ref 7–25)
CO2: 26 mmol/L (ref 20–32)
CREATININE: 1.59 mg/dL — AB (ref 0.70–1.11)
Calcium: 8.9 mg/dL (ref 8.6–10.3)
Chloride: 103 mmol/L (ref 98–110)
GFR, EST AFRICAN AMERICAN: 47 mL/min/{1.73_m2} — AB (ref >=60)
GFR, Est Non African American: 40 mL/min/{1.73_m2} — ABNORMAL LOW (ref >=60)
GLUCOSE: 101 mg/dL (ref 65–139)
Potassium: 5.7 mmol/L — ABNORMAL HIGH (ref 3.5–5.3)
Sodium: 132 mmol/L — ABNORMAL LOW (ref 135–146)

## 2017-11-11 LAB — HEMOCHROMATOSIS DNA-PCR(C282Y,H63D)

## 2017-11-13 NOTE — Progress Notes (Signed)
LMOM to call.

## 2017-11-13 NOTE — Progress Notes (Signed)
Pt is aware of results. He said he is urinating 3-4 times a day. Still complains of pain in the left side of his scrotum. Said he is lying down at this time and it is not hurting. But sometimes when he gets up it hurts so bad it feels like he is being stabbed. I told him to call the urologist, he was referred to them for that problem and he should call them at once, he said he would do so.  Forwarding to Manuela Schwartz to get the report from Urology on the initial visit. Sending to Walden Field, NP, in The absence of Roseanne Kaufman, NP.

## 2017-11-14 ENCOUNTER — Other Ambulatory Visit: Payer: Self-pay

## 2017-11-14 ENCOUNTER — Other Ambulatory Visit: Payer: Self-pay | Admitting: Nurse Practitioner

## 2017-11-14 DIAGNOSIS — K7460 Unspecified cirrhosis of liver: Secondary | ICD-10-CM

## 2017-11-14 DIAGNOSIS — N5089 Other specified disorders of the male genital organs: Secondary | ICD-10-CM | POA: Diagnosis not present

## 2017-11-14 NOTE — Progress Notes (Signed)
BMP order to f/u on previous labs.

## 2017-11-14 NOTE — Progress Notes (Signed)
LMOM for a return call. Bobby Tyler is faxing request again for records from Urologist).

## 2017-11-14 NOTE — Progress Notes (Signed)
Received the report from Urologist and placing on the desk for review in Anna's absence. He is at the hospital now.

## 2017-11-15 NOTE — Progress Notes (Signed)
Bobby Tyler, waiting on a return call from pt.

## 2017-11-16 NOTE — Progress Notes (Signed)
Pt is aware to do blood work first of next week. He said he saw urologist this week and he will be having surgery for a hernia, said that is what is causing his problem.  He said he does not have the date yet, but it will be at [facility]. Sending FYI to Walden Field, NP in the absence of Roseanne Kaufman, NP.

## 2017-11-16 NOTE — Progress Notes (Signed)
See previous note. Pt is aware to go to the lab early next week for blood work.

## 2017-11-20 ENCOUNTER — Encounter (HOSPITAL_COMMUNITY): Payer: Self-pay

## 2017-11-20 ENCOUNTER — Emergency Department (HOSPITAL_COMMUNITY)
Admission: EM | Admit: 2017-11-20 | Discharge: 2017-11-20 | Disposition: A | Discharge status: Home / Self Care | Payer: Medicare HMO | Attending: Emergency Medicine | Admitting: Emergency Medicine

## 2017-11-20 ENCOUNTER — Emergency Department (HOSPITAL_COMMUNITY): Payer: Medicare HMO

## 2017-11-20 DIAGNOSIS — K4090 Unilateral inguinal hernia, without obstruction or gangrene, not specified as recurrent: Secondary | ICD-10-CM | POA: Insufficient documentation

## 2017-11-20 DIAGNOSIS — Z79899 Other long term (current) drug therapy: Secondary | ICD-10-CM | POA: Diagnosis not present

## 2017-11-20 DIAGNOSIS — N50819 Testicular pain, unspecified: Secondary | ICD-10-CM | POA: Diagnosis not present

## 2017-11-20 DIAGNOSIS — R1032 Left lower quadrant pain: Secondary | ICD-10-CM | POA: Diagnosis present

## 2017-11-20 DIAGNOSIS — N5089 Other specified disorders of the male genital organs: Secondary | ICD-10-CM | POA: Diagnosis not present

## 2017-11-20 DIAGNOSIS — Z87891 Personal history of nicotine dependence: Secondary | ICD-10-CM | POA: Insufficient documentation

## 2017-11-20 DIAGNOSIS — N433 Hydrocele, unspecified: Secondary | ICD-10-CM | POA: Diagnosis not present

## 2017-11-20 LAB — CBC WITH DIFFERENTIAL/PLATELET
Basophils Absolute: 0.1 10*3/uL (ref 0.0–0.1)
Basophils Relative: 1 %
EOS ABS: 0.2 10*3/uL (ref 0.0–0.7)
Eosinophils Relative: 3 %
HCT: 35.1 % — ABNORMAL LOW (ref 39.0–52.0)
HEMOGLOBIN: 12 g/dL — AB (ref 13.0–17.0)
LYMPHS ABS: 1.4 10*3/uL (ref 0.7–4.0)
LYMPHS PCT: 18 %
MCH: 32.9 pg (ref 26.0–34.0)
MCHC: 34.2 g/dL (ref 30.0–36.0)
MCV: 96.2 fL (ref 78.0–100.0)
Monocytes Absolute: 1 10*3/uL (ref 0.1–1.0)
Monocytes Relative: 13 %
NEUTROS PCT: 65 %
Neutro Abs: 5.1 10*3/uL (ref 1.7–7.7)
Platelets: 141 10*3/uL — ABNORMAL LOW (ref 150–400)
RBC: 3.65 MIL/uL — AB (ref 4.22–5.81)
RDW: 15.5 % (ref 11.5–15.5)
WBC: 7.8 10*3/uL (ref 4.0–10.5)

## 2017-11-20 LAB — URINALYSIS, ROUTINE W REFLEX MICROSCOPIC
GLUCOSE, UA: NEGATIVE mg/dL
HGB URINE DIPSTICK: NEGATIVE
Ketones, ur: NEGATIVE mg/dL
NITRITE: NEGATIVE
PH: 5 (ref 5.0–8.0)
Protein, ur: 30 mg/dL — AB
Specific Gravity, Urine: 1.026 (ref 1.005–1.030)

## 2017-11-20 LAB — BASIC METABOLIC PANEL
ANION GAP: 4 — AB (ref 5–15)
BUN: 15 mg/dL (ref 8–23)
CHLORIDE: 105 mmol/L (ref 98–111)
CO2: 26 mmol/L (ref 22–32)
Calcium: 8.3 mg/dL — ABNORMAL LOW (ref 8.9–10.3)
Creatinine, Ser: 1.24 mg/dL (ref 0.61–1.24)
GFR calc Af Amer: >60 mL/min (ref >60)
GFR calc non Af Amer: 53 mL/min — ABNORMAL LOW (ref >60)
Glucose, Bld: 109 mg/dL — ABNORMAL HIGH (ref 70–99)
POTASSIUM: 4.4 mmol/L (ref 3.5–5.1)
Sodium: 135 mmol/L (ref 135–145)

## 2017-11-20 MED ORDER — TRAMADOL HCL 50 MG PO TABS: 50.0000 mg | ORAL_TABLET | Freq: Four times a day (QID) | ORAL | 0 refills | Status: DC | PRN

## 2017-11-20 NOTE — ED Provider Notes (Signed)
[facility] EMERGENCY DEPARTMENT Provider Note   CSN: [id number] Arrival date & time: 11/20/17  1603     History   Chief Complaint Chief Complaint  Patient presents with  . Groin Pain    HPI Bobby Tyler is a 82 y.o. male.  HPI Patient presents to the emergency room for evaluation of persistent groin swelling.  She states the symptoms have been going on for several months now.  Patient states his scrotum has been swollen and he has had pain on the left side of the inguinal region.  Patient denies any trouble with nausea or vomiting.  He denies any dysuria.  He denies any fevers or chills.  Palpating the left groin as well as certain positions causes increasing pain.  He does notice that sometimes the swelling gets better and worse.  Patient was seen in the emergency room back in May of this year.  CT scan of his abdomen pelvis that showed an cirrhosis with ascites.  He also had a left inguinal hernia containing sigmoid colon without evidence of bowel edema.  [facility].  They have been managing his cirrhosis.  Patient is taking Lasix.  Patient states he was supposed to go see a Psychologist, sport and exercise but no one called him to schedule an appointment. Past Medical History:  Diagnosis Date  . Cirrhosis ([place]) 08/2017    Patient Active Problem List   Diagnosis Date Noted  . Scrotal edema 10/30/2017  . Cirrhosis ([place]) 09/13/2017    Past Surgical History:  Procedure Laterality Date  . none          Home Medications    Prior to Admission medications   Medication Sig Start Date End Date Taking? Authorizing Provider  furosemide (LASIX) 20 MG tablet Take 1 tablet (20 mg total) by mouth daily. Patient not taking: Reported on 11/20/2017 10/28/17   Ripley Fraise, MD  spironolactone (ALDACTONE) 25 MG tablet Take 1 tablet (25 mg total) by mouth 2 (two) times daily. Patient not taking: Reported on 11/20/2017 10/28/17   Ripley Fraise, MD    traMADol (ULTRAM) 50 MG tablet Take 1 tablet (50 mg total) by mouth every 6 (six) hours as needed. 11/20/17   Dorie Rank, MD    Family History Family History  Problem Relation Age of Onset  . Liver cancer Neg Hx   . Cirrhosis Neg Hx   . Colon cancer Neg Hx     Social History Social History   Tobacco Use  . Smoking status: Former Smoker    Types: Cigarettes    Last attempt to quit: 1985    Years since quitting: 34.5  . Smokeless tobacco: Never Used  Substance Use Topics  . Alcohol use: Not Currently    Comment: never used regularly/heavily  . Drug use: Not Currently     Allergies   Patient has no known allergies.   Review of Systems Review of Systems  All other systems reviewed and are negative.    Physical Exam Updated Vital Signs BP 138/69 (BP Location: Right Arm)   Pulse (!) 56   Temp 97.8 F (36.6 C) (Oral)   Resp 16   Ht 1.778 m (5' 10" )   Wt 83.9 kg (185 lb)   SpO2 100%   BMI 26.54 kg/m   Physical Exam  Constitutional: He appears well-developed and well-nourished. No distress.  HENT:  Head: Normocephalic and atraumatic.  Right Ear: External ear normal.  Left Ear: External ear normal.  Eyes: Conjunctivae are normal. Right eye exhibits no discharge. Left eye exhibits no discharge. No scleral icterus.  Neck: Neck supple. No tracheal deviation present.  Cardiovascular: Normal rate, regular rhythm and intact distal pulses.  Pulmonary/Chest: Effort normal and breath sounds normal. No stridor. No respiratory distress. He has no wheezes. He has no rales.  Abdominal: Soft. Bowel sounds are normal. He exhibits no distension. There is no tenderness. There is no rebound and no guarding.  Genitourinary:  Genitourinary Comments: Edema in the scrotal region, tenderness palpation in the left inguinal region, difficult to appreciate the hernia with his scrotal edema,  Musculoskeletal: He exhibits no edema or tenderness.  Neurological: He is alert. He has normal  strength. No cranial nerve deficit (no facial droop, extraocular movements intact, no slurred speech) or sensory deficit. He exhibits normal muscle tone. He displays no seizure activity. Coordination normal.  Skin: Skin is warm and dry. No rash noted.  Psychiatric: He has a normal mood and affect.  Nursing note and vitals reviewed.    ED Treatments / Results  Labs (all labs ordered are listed, but only abnormal results are displayed) Labs Reviewed  CBC WITH DIFFERENTIAL/PLATELET - Abnormal; Notable for the following components:      Result Value   RBC 3.65 (*)    Hemoglobin 12.0 (*)    HCT 35.1 (*)    Platelets 141 (*)    All other components within normal limits  BASIC METABOLIC PANEL - Abnormal; Notable for the following components:   Glucose, Bld 109 (*)    Calcium 8.3 (*)    GFR calc non Af Amer 53 (*)    Anion gap 4 (*)    All other components within normal limits  URINALYSIS, ROUTINE W REFLEX MICROSCOPIC - Abnormal; Notable for the following components:   Color, Urine AMBER (*)    APPearance HAZY (*)    Bilirubin Urine SMALL (*)    Protein, ur 30 (*)    Leukocytes, UA SMALL (*)    Bacteria, UA RARE (*)    All other components within normal limits  URINE CULTURE    EKG None  Radiology US Scrotum W/doppler  Result Date: 11/20/2017 CLINICAL DATA:  Initial evaluation for scrotal swelling and edema. EXAM: SCROTAL ULTRASOUND DOPPLER ULTRASOUND OF THE TESTICLES TECHNIQUE: Complete ultrasound examination of the testicles, epididymis, and other scrotal structures was performed. Color and spectral Doppler ultrasound were also utilized to evaluate blood flow to the testicles. COMPARISON:  Prior ultrasound from 10/30/2017. FINDINGS: Right testicle Measurements: 3.5 x 1.6 x 2.3 cm. No mass or microlithiasis visualized. Left testicle Measurements: 3.2 x 1.9 x 2.3 cm. No mass or microlithiasis visualized. Right epididymis:  Normal in size and appearance. Left epididymis:  Normal in  size and appearance. Hydrocele:  Small right with moderate to large left hydrocele. Varicocele:  None visualized. Pulsed Doppler interrogation of both testes demonstrates normal low resistance arterial and venous waveforms bilaterally. Diffuse swelling seen throughout the overlying scrotal wall, similar to previous exam. No loculated collections. IMPRESSION: 1. Diffuse scrotal skin thickening with subcutaneous edema, nonspecific, but could reflect sequelae of on going and/or acute cellulitis. Correlation with symptomatology and physical exam recommended. 2. Moderate to large left with small right hydrocele. 3. Otherwise unremarkable scrotal ultrasound. No evidence for torsion or other acute abnormality. Electronically Signed   By: Jeannine Boga M.D.   On: 11/20/2017 17:49    Procedures Procedures (including critical care time)  Medications Ordered in ED Medications - No data to  display   Initial Impression / Assessment and Plan / ED Course  I have reviewed the triage vital signs and the nursing notes.  Pertinent labs & imaging results that were available during my care of the patient were reviewed by me and considered in my medical decision making (see chart for details).  Clinical Course as of Nov 21 2010  Mon Nov 20, 2017  1958 Few WBC noted in urine.      [JK]    Clinical Course User Index [JK] Dorie Rank, MD   Patient presents emergency room with several months of the groin swelling.  Patient has a history of cirrhosis and has some scrotal edema most likely related to that.  I doubt infection based on his exam and his symptoms.  Patient also has a known left inguinal hernia based on a CT scan performed several months ago.  Patient's exam is somewhat difficult because of his scrotal swelling but I doubt incarceration or other serious complication.  Patient is resting comfortably.  He has not had any trouble with nausea or vomiting or appetite.  I discussed the patient's previous  evaluations and his exam today.  I explained to him that I think his symptoms are most likely related to his hernia.  No indications for any emergent surgery.  Recommended outpatient follow-up with a general surgeon and also recommend he try wearing a hernia truss to see if that helps in the meantime.  Final Clinical Impressions(s) / ED Diagnoses   Final diagnoses:  Testicle pain  Unilateral inguinal hernia without obstruction or gangrene, recurrence not specified  Scrotal edema    ED Discharge Orders        Ordered    traMADol (ULTRAM) 50 MG tablet  Every 6 hours PRN     11/20/17 2007       Dorie Rank, MD 11/20/17 2012

## 2017-11-20 NOTE — Discharge Instructions (Addendum)
Consider wearing a hernia truss belt, Try your local pharmacy or medical supply store, return to the ED for fever, vomiting, worsening symptoms

## 2017-11-20 NOTE — ED Triage Notes (Signed)
Pt reports left sided groin pain 3 months ago. Pain has gotten progressively worse and increase with standing up. Pt says he has some swelling and difficulty urinating

## 2017-11-23 LAB — URINE CULTURE

## 2017-11-24 NOTE — Progress Notes (Signed)
ED Antimicrobial Stewardship Positive Culture Follow Up   Bobby Tyler is an 82 y.o. male who presented to [facility] on 11/20/2017 with a chief complaint of groin pain  Chief Complaint  Patient presents with  . Groin Pain    Recent Results (from the past 720 hour(s))  Urine culture     Status: None   Collection Time: 10/28/17  3:48 AM  Result Value Ref Range Status   Specimen Description   Final    URINE, CLEAN CATCH Performed at [facility], [address], [place], [place] [address]    Special Requests   Final    NONE Performed at [facility], [address] [place], [place] [address]    Culture   Final    NO GROWTH Performed at [facility], [place] [address] [place], [place] [address]    Report Status 10/29/2017 FINAL  Final  Urine Culture     Status: Abnormal   Collection Time: 11/20/17  8:11 PM  Result Value Ref Range Status   Specimen Description   Final    URINE, CLEAN CATCH Performed at [facility], [address], [place], [place] [address]    Special Requests   Final    NONE Performed at [facility], [address], [place],  [address]    Culture >=100,000 COLONIES/mL ENTEROCOCCUS FAECALIS (A)  Final   Report Status 11/23/2017 FINAL  Final   Organism ID, Bacteria ENTEROCOCCUS FAECALIS (A)  Final      Susceptibility   Enterococcus faecalis - MIC*    AMPICILLIN <=2 SENSITIVE Sensitive     LEVOFLOXACIN 2 SENSITIVE Sensitive     NITROFURANTOIN <=16 SENSITIVE Sensitive     VANCOMYCIN 1 SENSITIVE Sensitive     * >=100,000 COLONIES/mL ENTEROCOCCUS FAECALIS    Patient discharged originally without antimicrobial agent and treatment MAY now be indicated. Will notify patient of urine culture results and inquire about symptoms. If urinary symptoms (dysuria, increased urgency/frequency) have developed since discharge or if groin pain/swelling persists, will direct patient to contact their primary care provider for further management.  ED Provider:  Suella Broad, PA-C   Brendolyn Patty, PharmD PGY1 Pharmacy Resident Phone [phone]  11/24/2017   9:57 AM

## 2017-12-14 ENCOUNTER — Ambulatory Visit: Payer: Medicare HMO | Admitting: General Surgery

## 2017-12-14 ENCOUNTER — Encounter: Payer: Self-pay | Admitting: General Surgery

## 2017-12-14 VITALS — BP 156/73 | HR 74 | Temp 98.4°F | Resp 22 | Wt 211.0 lb

## 2017-12-14 DIAGNOSIS — K4090 Unilateral inguinal hernia, without obstruction or gangrene, not specified as recurrent: Secondary | ICD-10-CM

## 2017-12-14 NOTE — H&P (Signed)
Bobby Tyler; [id number]; [birth year]   HPI Patient is an 82 year old white male who was referred to my care by the emergency room and Dr.Budzyn for evaluation and treatment of left anal hernia.  Is been present for few months but seems to be causing him more discomfort of late.  He denies any nausea or vomiting.  He was newly diagnosed with ascites earlier this year that was felt to be secondary to nonspecific cirrhosis.  This seems to be well controlled.  He also complains of an umbilical hernia.  The hernia does reduce on its own when he lies down.  He states he has 10 out of 10 when the hernia sticks out, but he is smiling and ambulating around the room without difficulty. Past Medical History:  Diagnosis Date  . Cirrhosis ([place]) 08/2017    Past Surgical History:  Procedure Laterality Date  . none      Family History  Problem Relation Age of Onset  . Liver cancer Neg Hx   . Cirrhosis Neg Hx   . Colon cancer Neg Hx     Current Outpatient Medications on File Prior to Visit  Medication Sig Dispense Refill  . ibuprofen (ADVIL,MOTRIN) 200 MG tablet Take 200 mg by mouth every 6 (six) hours as needed.    . furosemide (LASIX) 20 MG tablet Take 1 tablet (20 mg total) by mouth daily. (Patient not taking: Reported on 11/20/2017) 10 tablet 0  . spironolactone (ALDACTONE) 25 MG tablet Take 1 tablet (25 mg total) by mouth 2 (two) times daily. (Patient not taking: Reported on 11/20/2017) 20 tablet 0  . traMADol (ULTRAM) 50 MG tablet Take 1 tablet (50 mg total) by mouth every 6 (six) hours as needed. (Patient not taking: Reported on 12/14/2017) 15 tablet 0   No current facility-administered medications on file prior to visit.     No Known Allergies  Social History   Substance and Sexual Activity  Alcohol Use Not Currently   Comment: never used regularly/heavily    Social History   Tobacco Use  Smoking Status Former Smoker  . Types: Cigarettes  . Last attempt to quit: 1985  . Years  since quitting: 34.6  Smokeless Tobacco Never Used    Review of Systems  Constitutional: Negative.   HENT: Negative.   Eyes: Negative.   Respiratory: Negative.   Cardiovascular: Negative.   Gastrointestinal: Negative.   Genitourinary: Positive for urgency.  Musculoskeletal: Negative.   Neurological: Negative.   Endo/Heme/Allergies: Negative.   Psychiatric/Behavioral: Negative.     Objective   Vitals:   12/14/17 1150  BP: (!) 156/73  Pulse: 74  Resp: (!) 22  Temp: 98.4 F (36.9 C)    Physical Exam  Constitutional: He is oriented to person, place, and time. He appears well-developed and well-nourished. No distress.  HENT:  Head: Normocephalic and atraumatic.  Cardiovascular: Normal rate, regular rhythm and normal heart sounds. Exam reveals no gallop and no friction rub.  No murmur heard. Pulmonary/Chest: Effort normal and breath sounds normal. No stridor. No respiratory distress. He has no wheezes. He has no rales.  Abdominal: Soft. Bowel sounds are normal. He exhibits no distension and no mass. There is no tenderness. There is no guarding. A hernia is present.  He has both an easily reducible umbilical hernia and a reducible left inguinal hernia.  There is no erythema surrounding the hernias.  I do not appreciate a significant level of ascites within the abdomen.  Neurological: He is alert and oriented to  person, place, and time.  Skin: Skin is warm and dry.  Vitals reviewed. GI notes reviewed Ultrasound report reviewed  Assessment  Left inguinal hernia, umbilical hernia, recent diagnosis of cirrhosis secondary to NASH Plan   As patient does not seem to have any significant decompensation with the cirrhosis at this time, we will proceed with a left inguinal herniorrhaphy with mesh.  I told him I would not recommend an umbilical herniorrhaphy at this time.  The risks and benefits of the procedure including bleeding, infection, hepatobiliary injury, and the possibility of  open procedure were fully explained to the patient, who gave informed consent.  He is scheduled for 12/22/2017.

## 2017-12-14 NOTE — Progress Notes (Signed)
Bobby Tyler; [id number]; [birth year]   HPI Patient is an 82 year old white male who was referred to my care by the emergency room and Dr.Budzyn for evaluation and treatment of left anal hernia.  Is been present for few months but seems to be causing him more discomfort of late.  He denies any nausea or vomiting.  He was newly diagnosed with ascites earlier this year that was felt to be secondary to nonspecific cirrhosis.  This seems to be well controlled.  He also complains of an umbilical hernia.  The hernia does reduce on its own when he lies down.  He states he has 10 out of 10 when the hernia sticks out, but he is smiling and ambulating around the room without difficulty. Past Medical History:  Diagnosis Date  . Cirrhosis ([place]) 08/2017    Past Surgical History:  Procedure Laterality Date  . none      Family History  Problem Relation Age of Onset  . Liver cancer Neg Hx   . Cirrhosis Neg Hx   . Colon cancer Neg Hx     Current Outpatient Medications on File Prior to Visit  Medication Sig Dispense Refill  . ibuprofen (ADVIL,MOTRIN) 200 MG tablet Take 200 mg by mouth every 6 (six) hours as needed.    . furosemide (LASIX) 20 MG tablet Take 1 tablet (20 mg total) by mouth daily. (Patient not taking: Reported on 11/20/2017) 10 tablet 0  . spironolactone (ALDACTONE) 25 MG tablet Take 1 tablet (25 mg total) by mouth 2 (two) times daily. (Patient not taking: Reported on 11/20/2017) 20 tablet 0  . traMADol (ULTRAM) 50 MG tablet Take 1 tablet (50 mg total) by mouth every 6 (six) hours as needed. (Patient not taking: Reported on 12/14/2017) 15 tablet 0   No current facility-administered medications on file prior to visit.     No Known Allergies  Social History   Substance and Sexual Activity  Alcohol Use Not Currently   Comment: never used regularly/heavily    Social History   Tobacco Use  Smoking Status Former Smoker  . Types: Cigarettes  . Last attempt to quit: 1985  . Years  since quitting: 34.6  Smokeless Tobacco Never Used    Review of Systems  Constitutional: Negative.   HENT: Negative.   Eyes: Negative.   Respiratory: Negative.   Cardiovascular: Negative.   Gastrointestinal: Negative.   Genitourinary: Positive for urgency.  Musculoskeletal: Negative.   Neurological: Negative.   Endo/Heme/Allergies: Negative.   Psychiatric/Behavioral: Negative.     Objective   Vitals:   12/14/17 1150  BP: (!) 156/73  Pulse: 74  Resp: (!) 22  Temp: 98.4 F (36.9 C)    Physical Exam  Constitutional: He is oriented to person, place, and time. He appears well-developed and well-nourished. No distress.  HENT:  Head: Normocephalic and atraumatic.  Cardiovascular: Normal rate, regular rhythm and normal heart sounds. Exam reveals no gallop and no friction rub.  No murmur heard. Pulmonary/Chest: Effort normal and breath sounds normal. No stridor. No respiratory distress. He has no wheezes. He has no rales.  Abdominal: Soft. Bowel sounds are normal. He exhibits no distension and no mass. There is no tenderness. There is no guarding. A hernia is present.  He has both an easily reducible umbilical hernia and a reducible left inguinal hernia.  There is no erythema surrounding the hernias.  I do not appreciate a significant level of ascites within the abdomen.  Neurological: He is alert and oriented to  person, place, and time.  Skin: Skin is warm and dry.  Vitals reviewed. GI notes reviewed Ultrasound report reviewed  Assessment  Left inguinal hernia, umbilical hernia, recent diagnosis of cirrhosis secondary to NASH Plan   As patient does not seem to have any significant decompensation with the cirrhosis at this time, we will proceed with a left inguinal herniorrhaphy with mesh.  I told him I would not recommend an umbilical herniorrhaphy at this time.  The risks and benefits of the procedure including bleeding, infection, hepatobiliary injury, and the possibility of  open procedure were fully explained to the patient, who gave informed consent.  He is scheduled for 12/22/2017.

## 2017-12-14 NOTE — Patient Instructions (Signed)

## 2017-12-15 NOTE — Patient Instructions (Signed)
Bobby Tyler  12/15/2017     @PREFPERIOPPHARMACY @   Your procedure is scheduled on  12/22/2017   Report to [facility] at  83   A.M.  Call this number if you have problems the morning of surgery:  [phone]   Remember:  Do not eat or drink after midnight.  You may drink clear liquids until  12 midnight 12/21/2017 .  Clear liquids allowed are:                    Water, Juice (non-citric and without pulp), Carbonated beverages, Clear Tea, Black Coffee only, Plain Jell-O only, Gatorade and Plain Popsicles only    Take these medicines the morning of surgery with A SIP OF WATER  Ultram.    Do not wear jewelry, make-up or nail polish.  Do not wear lotions, powders, or perfumes, or deodorant.  Do not shave 48 hours prior to surgery.  Men may shave face and neck.  Do not bring valuables to the hospital.  [facility] is not responsible for any belongings or valuables.  Contacts, dentures or bridgework may not be worn into surgery.  Leave your suitcase in the car.  After surgery it may be brought to your room.  For patients admitted to the hospital, discharge time will be determined by your treatment team.  Patients discharged the day of surgery will not be allowed to drive home.   Name and phone number of your driver:   family Special instructions:  None  Please read over the following fact sheets that you were given. Anesthesia Post-op Instructions and Care and Recovery After Surgery       Open Hernia Repair, Adult Open hernia repair is a surgical procedure to fix a hernia. A hernia occurs when an internal organ or tissue pushes out through a weak spot in the abdominal wall muscles. Hernias commonly occur in the groin and around the navel. Most hernias tend to get worse over time. Often, surgery is done to prevent the hernia from becoming bigger, uncomfortable, or an emergency. Emergency surgery may be needed if abdominal contents get stuck in the opening  (incarcerated hernia) or the blood supply gets cut off (strangulated hernia). In an open repair, an incision is made in the abdomen to perform the surgery. Tell a health care provider about:  Any allergies you have.  All medicines you are taking, including vitamins, herbs, eye drops, creams, and over-the-counter medicines.  Any problems you or family members have had with anesthetic medicines.  Any blood or bone disorders you have.  Any surgeries you have had.  Any medical conditions you have, including any recent cold or flu symptoms.  Whether you are pregnant or may be pregnant. What are the risks? Generally, this is a safe procedure. However, problems may occur, including:  Long-lasting (chronic) pain.  Bleeding.  Infection.  Damage to the testicle. This can cause shrinking or swelling.  Damage to the bladder, blood vessels, intestine, or nerves near the hernia.  Trouble passing urine.  Allergic reactions to medicines.  Return of the hernia.  What happens before the procedure? Staying hydrated Follow instructions from your health care provider about hydration, which may include:  Up to 2 hours before the procedure - you may continue to drink clear liquids, such as water, clear fruit juice, black coffee, and plain tea.  Eating and drinking restrictions Follow instructions from your health care provider about  eating and drinking, which may include:  8 hours before the procedure - stop eating heavy meals or foods such as meat, fried foods, or fatty foods.  6 hours before the procedure - stop eating light meals or foods, such as toast or cereal.  6 hours before the procedure - stop drinking milk or drinks that contain milk.  2 hours before the procedure - stop drinking clear liquids.  Medicines  Ask your health care provider about: ? Changing or stopping your regular medicines. This is especially important if you are taking diabetes medicines or blood  thinners. ? Taking medicines such as aspirin and ibuprofen. These medicines can thin your blood. Do not take these medicines before your procedure if your health care provider instructs you not to.  You may be given antibiotic medicine to help prevent infection. General instructions  You may have blood tests or imaging studies.  Ask your health care provider how your surgical site will be marked or identified.  If you smoke, do not smoke for at least 2 weeks before your procedure or for as long as told by your health care provider.  Let your health care provider know if you develop a cold or any infection before your surgery.  Plan to have someone take you home from the hospital or clinic.  If you will be going home right after the procedure, plan to have someone with you for 24 hours. What happens during the procedure?  To reduce your risk of infection: ? Your health care team will wash or sanitize their hands. ? Your skin will be washed with soap. ? Hair may be removed from the surgical area.  An IV tube will be inserted into one of your veins.  You will be given one or more of the following: ? A medicine to help you relax (sedative). ? A medicine to numb the area (local anesthetic). ? A medicine to make you fall asleep (general anesthetic).  Your surgeon will make an incision over the hernia.  The tissues of the hernia will be moved back into place.  The edges of the hernia may be stitched together.  The opening in the abdominal muscles will be closed with stitches (sutures). Or, your surgeon will place a mesh patch made of manmade (synthetic) material over the opening.  The incision will be closed.  A bandage (dressing) may be placed over the incision. The procedure may vary among health care providers and hospitals. What happens after the procedure?  Your blood pressure, heart rate, breathing rate, and blood oxygen level will be monitored until the medicines you were  given have worn off.  You may be given medicine for pain.  Do not drive for 24 hours if you received a sedative. This information is not intended to replace advice given to you by your health care provider. Make sure you discuss any questions you have with your health care provider. Document Released: 10/05/2000 Document Revised: 10/30/2015 Document Reviewed: 09/23/2015 Elsevier Interactive Patient Education  2018 [place], Adult, Care After These instructions give you information about caring for yourself after your procedure. Your doctor may also give you more specific instructions. If you have problems or questions, contact your doctor. Follow these instructions at home: Surgical cut (incision) care   Follow instructions from your doctor about how to take care of your surgical cut area. Make sure you: ? Wash your hands with soap and water before you change your bandage (dressing). If  you cannot use soap and water, use hand sanitizer. ? Change your bandage as told by your doctor. ? Leave stitches (sutures), skin glue, or skin tape (adhesive) strips in place. They may need to stay in place for 2 weeks or longer. If tape strips get loose and curl up, you may trim the loose edges. Do not remove tape strips completely unless your doctor says it is okay.  Check your surgical cut every day for signs of infection. Check for: ? More redness, swelling, or pain. ? More fluid or blood. ? Warmth. ? Pus or a bad smell. Activity  Do not drive or use heavy machinery while taking prescription pain medicine. Do not drive until your doctor says it is okay.  Until your doctor says it is okay: ? Do not lift anything that is heavier than 10 lb (4.5 kg). ? Do not play contact sports.  Return to your normal activities as told by your doctor. Ask your doctor what activities are safe. General instructions  To prevent or treat having a hard time pooping (constipation) while you  are taking prescription pain medicine, your doctor may recommend that you: ? Drink enough fluid to keep your pee (urine) clear or pale yellow. ? Take over-the-counter or prescription medicines. ? Eat foods that are high in fiber, such as fresh fruits and vegetables, whole grains, and beans. ? Limit foods that are high in fat and processed sugars, such as fried and sweet foods.  Take over-the-counter and prescription medicines only as told by your doctor.  Do not take baths, swim, or use a hot tub until your doctor says it is okay.  Keep all follow-up visits as told by your doctor. This is important. Contact a doctor if:  You develop a rash.  You have more redness, swelling, or pain around your surgical cut.  You have more fluid or blood coming from your surgical cut.  Your surgical cut feels warm to the touch.  You have pus or a bad smell coming from your surgical cut.  You have a fever or chills.  You have blood in your poop (stool).  You have not pooped in 2-3 days.  Medicine does not help your pain. Get help right away if:  You have chest pain or you are short of breath.  You feel light-headed.  You feel weak and dizzy (feel faint).  You have very bad pain.  You throw up (vomit) and your pain is worse. This information is not intended to replace advice given to you by your health care provider. Make sure you discuss any questions you have with your health care provider. Document Released: 05/02/2014 Document Revised: 10/30/2015 Document Reviewed: 09/23/2015 Elsevier Interactive Patient Education  2018 [place] Anesthesia, Adult General anesthesia is the use of medicines to make a person "go to sleep" (be unconscious) for a medical procedure. General anesthesia is often recommended when a procedure:  Is long.  Requires you to be still or in an unusual position.  Is major and can cause you to lose blood.  Is impossible to do without general  anesthesia.  The medicines used for general anesthesia are called general anesthetics. In addition to making you sleep, the medicines:  Prevent pain.  Control your blood pressure.  Relax your muscles.  Tell a health care provider about:  Any allergies you have.  All medicines you are taking, including vitamins, herbs, eye drops, creams, and over-the-counter medicines.  Any problems you or family members  have had with anesthetic medicines.  Types of anesthetics you have had in the past.  Any bleeding disorders you have.  Any surgeries you have had.  Any medical conditions you have.  Any history of heart or lung conditions, such as heart failure, sleep apnea, or chronic obstructive pulmonary disease (COPD).  Whether you are pregnant or may be pregnant.  Whether you use tobacco, alcohol, marijuana, or street drugs.  Any history of Armed forces logistics/support/administrative officer.  Any history of depression or anxiety. What are the risks? Generally, this is a safe procedure. However, problems may occur, including:  Allergic reaction to anesthetics.  Lung and heart problems.  Inhaling food or liquids from your stomach into your lungs (aspiration).  Injury to nerves.  Waking up during your procedure and being unable to move (rare).  Extreme agitation or a state of mental confusion (delirium) when you wake up from the anesthetic.  Air in the bloodstream, which can lead to stroke.  These problems are more likely to develop if you are having a major surgery or if you have an advanced medical condition. You can prevent some of these complications by answering all of your health care provider's questions thoroughly and by following all pre-procedure instructions. General anesthesia can cause side effects, including:  Nausea or vomiting  A sore throat from the breathing tube.  Feeling cold or shivery.  Feeling tired, washed out, or achy.  Sleepiness or drowsiness.  Confusion or  agitation.  What happens before the procedure? Staying hydrated Follow instructions from your health care provider about hydration, which may include:  Up to 2 hours before the procedure - you may continue to drink clear liquids, such as water, clear fruit juice, black coffee, and plain tea.  Eating and drinking restrictions Follow instructions from your health care provider about eating and drinking, which may include:  8 hours before the procedure - stop eating heavy meals or foods such as meat, fried foods, or fatty foods.  6 hours before the procedure - stop eating light meals or foods, such as toast or cereal.  6 hours before the procedure - stop drinking milk or drinks that contain milk.  2 hours before the procedure - stop drinking clear liquids.  Medicines  Ask your health care provider about: ? Changing or stopping your regular medicines. This is especially important if you are taking diabetes medicines or blood thinners. ? Taking medicines such as aspirin and ibuprofen. These medicines can thin your blood. Do not take these medicines before your procedure if your health care provider instructs you not to. ? Taking new dietary supplements or medicines. Do not take these during the week before your procedure unless your health care provider approves them.  If you are told to take a medicine or to continue taking a medicine on the day of the procedure, take the medicine with sips of water. General instructions   Ask if you will be going home the same day, the following day, or after a longer hospital stay. ? Plan to have someone take you home. ? Plan to have someone stay with you for the first 24 hours after you leave the hospital or clinic.  For 3-6 weeks before the procedure, try not to use any tobacco products, such as cigarettes, chewing tobacco, and e-cigarettes.  You may brush your teeth on the morning of the procedure, but make sure to spit out the toothpaste. What  happens during the procedure?  You will be given anesthetics through a  mask and through an IV tube in one of your veins.  You may receive medicine to help you relax (sedative).  As soon as you are asleep, a breathing tube may be used to help you breathe.  An anesthesia specialist will stay with you throughout the procedure. He or she will help keep you comfortable and safe by continuing to give you medicines and adjusting the amount of medicine that you get. He or she will also watch your blood pressure, pulse, and oxygen levels to make sure that the anesthetics do not cause any problems.  If a breathing tube was used to help you breathe, it will be removed before you wake up. The procedure may vary among health care providers and hospitals. What happens after the procedure?  You will wake up, often slowly, after the procedure is complete, usually in a recovery area.  Your blood pressure, heart rate, breathing rate, and blood oxygen level will be monitored until the medicines you were given have worn off.  You may be given medicine to help you calm down if you feel anxious or agitated.  If you will be going home the same day, your health care provider may check to make sure you can stand, drink, and urinate.  Your health care providers will treat your pain and side effects before you go home.  Do not drive for 24 hours if you received a sedative.  You may: ? Feel nauseous and vomit. ? Have a sore throat. ? Have mental slowness. ? Feel cold or shivery. ? Feel sleepy. ? Feel tired. ? Feel sore or achy, even in parts of your body where you did not have surgery. This information is not intended to replace advice given to you by your health care provider. Make sure you discuss any questions you have with your health care provider. Document Released: 07/19/2007 Document Revised: 09/22/2015 Document Reviewed: 03/26/2015 Elsevier Interactive Patient Education  2018 [place]  Anesthesia, Adult, Care After These instructions provide you with information about caring for yourself after your procedure. Your health care provider may also give you more specific instructions. Your treatment has been planned according to current medical practices, but problems sometimes occur. Call your health care provider if you have any problems or questions after your procedure. What can I expect after the procedure? After the procedure, it is common to have:  Vomiting.  A sore throat.  Mental slowness.  It is common to feel:  Nauseous.  Cold or shivery.  Sleepy.  Tired.  Sore or achy, even in parts of your body where you did not have surgery.  Follow these instructions at home: For at least 24 hours after the procedure:  Do not: ? Participate in activities where you could fall or become injured. ? Drive. ? Use heavy machinery. ? Drink alcohol. ? Take sleeping pills or medicines that cause drowsiness. ? Make important decisions or sign legal documents. ? Take care of children on your own.  Rest. Eating and drinking  If you vomit, drink water, juice, or soup when you can drink without vomiting.  Drink enough fluid to keep your urine clear or pale yellow.  Make sure you have little or no nausea before eating solid foods.  Follow the diet recommended by your health care provider. General instructions  Have a responsible adult stay with you until you are awake and alert.  Return to your normal activities as told by your health care provider. Ask your health care provider  what activities are safe for you.  Take over-the-counter and prescription medicines only as told by your health care provider.  If you smoke, do not smoke without supervision.  Keep all follow-up visits as told by your health care provider. This is important. Contact a health care provider if:  You continue to have nausea or vomiting at home, and medicines are not helpful.  You cannot  drink fluids or start eating again.  You cannot urinate after 8-12 hours.  You develop a skin rash.  You have fever.  You have increasing redness at the site of your procedure. Get help right away if:  You have difficulty breathing.  You have chest pain.  You have unexpected bleeding.  You feel that you are having a life-threatening or urgent problem. This information is not intended to replace advice given to you by your health care provider. Make sure you discuss any questions you have with your health care provider. Document Released: 07/18/2000 Document Revised: 09/14/2015 Document Reviewed: 03/26/2015 Elsevier Interactive Patient Education  [employer].

## 2017-12-20 ENCOUNTER — Other Ambulatory Visit: Payer: Self-pay

## 2017-12-20 ENCOUNTER — Encounter (HOSPITAL_COMMUNITY): Payer: Self-pay

## 2017-12-20 ENCOUNTER — Encounter (HOSPITAL_COMMUNITY)
Admission: RE | Admit: 2017-12-20 | Discharge: 2017-12-20 | Disposition: A | Discharge status: Home / Self Care | Payer: Medicare HMO | Source: Ambulatory Visit | Attending: General Surgery | Admitting: General Surgery

## 2017-12-20 DIAGNOSIS — K4090 Unilateral inguinal hernia, without obstruction or gangrene, not specified as recurrent: Secondary | ICD-10-CM | POA: Diagnosis not present

## 2017-12-20 DIAGNOSIS — N433 Hydrocele, unspecified: Secondary | ICD-10-CM | POA: Diagnosis not present

## 2017-12-20 DIAGNOSIS — Z79899 Other long term (current) drug therapy: Secondary | ICD-10-CM | POA: Diagnosis not present

## 2017-12-20 DIAGNOSIS — I10 Essential (primary) hypertension: Secondary | ICD-10-CM | POA: Diagnosis not present

## 2017-12-20 DIAGNOSIS — K7460 Unspecified cirrhosis of liver: Secondary | ICD-10-CM | POA: Diagnosis not present

## 2017-12-20 DIAGNOSIS — K7581 Nonalcoholic steatohepatitis (NASH): Secondary | ICD-10-CM | POA: Diagnosis not present

## 2017-12-20 DIAGNOSIS — K429 Umbilical hernia without obstruction or gangrene: Secondary | ICD-10-CM | POA: Diagnosis not present

## 2017-12-20 DIAGNOSIS — Z87891 Personal history of nicotine dependence: Secondary | ICD-10-CM | POA: Diagnosis not present

## 2017-12-20 HISTORY — DX: Essential (primary) hypertension: I10

## 2017-12-20 HISTORY — DX: Unspecified hearing loss, unspecified ear: H91.90

## 2017-12-20 LAB — CBC WITH DIFFERENTIAL/PLATELET
BASOS ABS: 0.1 10*3/uL (ref 0.0–0.1)
Basophils Relative: 1 %
EOS PCT: 5 %
Eosinophils Absolute: 0.3 10*3/uL (ref 0.0–0.7)
HCT: 35.5 % — ABNORMAL LOW (ref 39.0–52.0)
Hemoglobin: 12 g/dL — ABNORMAL LOW (ref 13.0–17.0)
LYMPHS PCT: 21 %
Lymphs Abs: 1.6 10*3/uL (ref 0.7–4.0)
MCH: 33.1 pg (ref 26.0–34.0)
MCHC: 33.8 g/dL (ref 30.0–36.0)
MCV: 97.8 fL (ref 78.0–100.0)
MONO ABS: 0.8 10*3/uL (ref 0.1–1.0)
Monocytes Relative: 11 %
Neutro Abs: 4.8 10*3/uL (ref 1.7–7.7)
Neutrophils Relative %: 62 %
PLATELETS: 153 10*3/uL (ref 150–400)
RBC: 3.63 MIL/uL — AB (ref 4.22–5.81)
RDW: 15.2 % (ref 11.5–15.5)
WBC: 7.6 10*3/uL (ref 4.0–10.5)

## 2017-12-20 LAB — BASIC METABOLIC PANEL
Anion gap: 5 (ref 5–15)
BUN: 20 mg/dL (ref 8–23)
CO2: 26 mmol/L (ref 22–32)
CREATININE: 1.26 mg/dL — AB (ref 0.61–1.24)
Calcium: 8.4 mg/dL — ABNORMAL LOW (ref 8.9–10.3)
Chloride: 105 mmol/L (ref 98–111)
GFR calc Af Amer: >60 mL/min (ref >60)
GFR, EST NON AFRICAN AMERICAN: 52 mL/min — AB (ref >60)
Glucose, Bld: 126 mg/dL — ABNORMAL HIGH (ref 70–99)
Potassium: 4.1 mmol/L (ref 3.5–5.1)
SODIUM: 136 mmol/L (ref 135–145)

## 2017-12-20 LAB — PROTIME-INR
INR: 1.38
Prothrombin Time: 16.8 s — ABNORMAL HIGH (ref 11.4–15.2)

## 2017-12-22 ENCOUNTER — Ambulatory Visit (HOSPITAL_COMMUNITY): Payer: Medicare HMO | Admitting: Anesthesiology

## 2017-12-22 ENCOUNTER — Encounter (HOSPITAL_COMMUNITY): Payer: Self-pay | Admitting: *Deleted

## 2017-12-22 ENCOUNTER — Encounter (HOSPITAL_COMMUNITY)
Admission: RE | Disposition: A | Discharge status: Home / Self Care | Payer: Self-pay | Source: Ambulatory Visit | Attending: General Surgery

## 2017-12-22 ENCOUNTER — Ambulatory Visit (HOSPITAL_COMMUNITY)
Admission: RE | Admit: 2017-12-22 | Discharge: 2017-12-22 | Disposition: A | Discharge status: Home / Self Care | Payer: Medicare HMO | Source: Ambulatory Visit | Attending: General Surgery | Admitting: General Surgery

## 2017-12-22 DIAGNOSIS — Z87891 Personal history of nicotine dependence: Secondary | ICD-10-CM | POA: Diagnosis not present

## 2017-12-22 DIAGNOSIS — K7460 Unspecified cirrhosis of liver: Secondary | ICD-10-CM | POA: Insufficient documentation

## 2017-12-22 DIAGNOSIS — N433 Hydrocele, unspecified: Secondary | ICD-10-CM | POA: Diagnosis not present

## 2017-12-22 DIAGNOSIS — K4090 Unilateral inguinal hernia, without obstruction or gangrene, not specified as recurrent: Secondary | ICD-10-CM | POA: Insufficient documentation

## 2017-12-22 DIAGNOSIS — K429 Umbilical hernia without obstruction or gangrene: Secondary | ICD-10-CM | POA: Insufficient documentation

## 2017-12-22 DIAGNOSIS — Z79899 Other long term (current) drug therapy: Secondary | ICD-10-CM | POA: Diagnosis not present

## 2017-12-22 DIAGNOSIS — K7581 Nonalcoholic steatohepatitis (NASH): Secondary | ICD-10-CM | POA: Diagnosis not present

## 2017-12-22 DIAGNOSIS — I10 Essential (primary) hypertension: Secondary | ICD-10-CM | POA: Insufficient documentation

## 2017-12-22 HISTORY — PX: INGUINAL HERNIA REPAIR: SHX194

## 2017-12-22 SURGERY — REPAIR, HERNIA, INGUINAL, ADULT
Anesthesia: General | Site: Groin | Laterality: Left

## 2017-12-22 MED ORDER — ROCURONIUM BROMIDE 100 MG/10ML IV SOLN
INTRAVENOUS | Status: DC | PRN
  Administered 2017-12-22: 5 mg via INTRAVENOUS

## 2017-12-22 MED ORDER — CEFAZOLIN SODIUM-DEXTROSE 2-4 GM/100ML-% IV SOLN
2.0000 g | INTRAVENOUS | Status: AC
  Administered 2017-12-22: 2 g via INTRAVENOUS

## 2017-12-22 MED ORDER — CHLORHEXIDINE GLUCONATE CLOTH 2 % EX PADS: 6.0000 | MEDICATED_PAD | Freq: Once | CUTANEOUS | Status: DC

## 2017-12-22 MED ORDER — SODIUM CHLORIDE 0.9 % IR SOLN
Status: DC | PRN
  Administered 2017-12-22: 1000 mL

## 2017-12-22 MED ORDER — PROPOFOL 10 MG/ML IV BOLUS
INTRAVENOUS | Status: DC | PRN
  Administered 2017-12-22: 120 mg via INTRAVENOUS

## 2017-12-22 MED ORDER — FENTANYL CITRATE (PF) 100 MCG/2ML IJ SOLN
INTRAMUSCULAR | Status: DC | PRN
  Administered 2017-12-22 (×4): 25 ug via INTRAVENOUS

## 2017-12-22 MED ORDER — FENTANYL CITRATE (PF) 100 MCG/2ML IJ SOLN
INTRAMUSCULAR | Status: AC
  Filled 2017-12-22: qty 2

## 2017-12-22 MED ORDER — OXYCODONE HCL 5 MG PO TABS: 5.0000 mg | ORAL_TABLET | Freq: Four times a day (QID) | ORAL | 0 refills | Status: DC | PRN

## 2017-12-22 MED ORDER — FENTANYL CITRATE (PF) 100 MCG/2ML IJ SOLN: 25.0000 ug | INTRAMUSCULAR | Status: DC | PRN

## 2017-12-22 MED ORDER — HYDROCODONE-ACETAMINOPHEN 7.5-325 MG PO TABS
1.0000 | ORAL_TABLET | Freq: Once | ORAL | Status: AC | PRN
  Administered 2017-12-22: 1 via ORAL
  Filled 2017-12-22: qty 1

## 2017-12-22 MED ORDER — LACTATED RINGERS IV SOLN
INTRAVENOUS | Status: DC
  Administered 2017-12-22: 1000 mL via INTRAVENOUS

## 2017-12-22 MED ORDER — CEFAZOLIN SODIUM-DEXTROSE 2-4 GM/100ML-% IV SOLN
INTRAVENOUS | Status: AC
  Filled 2017-12-22: qty 100

## 2017-12-22 MED ORDER — SUCCINYLCHOLINE CHLORIDE 20 MG/ML IJ SOLN
INTRAMUSCULAR | Status: DC | PRN
  Administered 2017-12-22: 120 mg via INTRAVENOUS

## 2017-12-22 MED ORDER — BUPIVACAINE LIPOSOME 1.3 % IJ SUSP
INTRAMUSCULAR | Status: DC | PRN
  Administered 2017-12-22: 20 mL

## 2017-12-22 SURGICAL SUPPLY — 43 items
CATH ROBINSON RED A/P 16FR (CATHETERS) ×3 IMPLANT
CLOTH BEACON ORANGE TIMEOUT ST (SAFETY) ×3 IMPLANT
COVER LIGHT HANDLE STERIS (MISCELLANEOUS) ×6 IMPLANT
DERMABOND ADVANCED (GAUZE/BANDAGES/DRESSINGS) ×2
DERMABOND ADVANCED .7 DNX12 (GAUZE/BANDAGES/DRESSINGS) ×1 IMPLANT
DRAIN PENROSE 18X1/2 LTX STRL (DRAIN) ×3 IMPLANT
ELECT REM PT RETURN 9FT ADLT (ELECTROSURGICAL) ×3
ELECTRODE REM PT RTRN 9FT ADLT (ELECTROSURGICAL) ×1 IMPLANT
GAUZE SPONGE 4X4 12PLY STRL (GAUZE/BANDAGES/DRESSINGS) ×6 IMPLANT
GLOVE BIOGEL PI IND STRL 6.5 (GLOVE) ×1 IMPLANT
GLOVE BIOGEL PI IND STRL 7.0 (GLOVE) ×3 IMPLANT
GLOVE BIOGEL PI INDICATOR 6.5 (GLOVE) ×2
GLOVE BIOGEL PI INDICATOR 7.0 (GLOVE) ×6
GLOVE SURG SS PI 7.5 STRL IVOR (GLOVE) ×3 IMPLANT
GOWN STRL REUS W/ TWL XL LVL3 (GOWN DISPOSABLE) ×1 IMPLANT
GOWN STRL REUS W/TWL LRG LVL3 (GOWN DISPOSABLE) ×6 IMPLANT
GOWN STRL REUS W/TWL XL LVL3 (GOWN DISPOSABLE) ×2
INST SET MINOR GENERAL (KITS) ×3 IMPLANT
KIT TURNOVER KIT A (KITS) ×3 IMPLANT
MANIFOLD NEPTUNE II (INSTRUMENTS) ×3 IMPLANT
MESH HERNIA 1.6X1.9 PLUG LRG (Mesh General) ×1 IMPLANT
MESH HERNIA PLUG LRG (Mesh General) ×2 IMPLANT
MESH MARLEX PLUG MEDIUM (Mesh General) ×3 IMPLANT
NEEDLE HYPO 21X1.5 SAFETY (NEEDLE) ×3 IMPLANT
NS IRRIG 1000ML POUR BTL (IV SOLUTION) ×3 IMPLANT
PACK MINOR (CUSTOM PROCEDURE TRAY) ×3 IMPLANT
PAD ARMBOARD 7.5X6 YLW CONV (MISCELLANEOUS) ×3 IMPLANT
SET BASIN LINEN APH (SET/KITS/TRAYS/PACK) ×3 IMPLANT
SOL PREP PROV IODINE SCRUB 4OZ (MISCELLANEOUS) ×3 IMPLANT
STAPLER VISISTAT (STAPLE) ×3 IMPLANT
SURGILUBE 3G PEEL PACK STRL (MISCELLANEOUS) ×3 IMPLANT
SUT MNCRL AB 4-0 PS2 18 (SUTURE) ×3 IMPLANT
SUT NOVA NAB GS-22 2 2-0 T-19 (SUTURE) ×9 IMPLANT
SUT PROLENE 2 0 SH 30 (SUTURE) IMPLANT
SUT SILK 3 0 (SUTURE)
SUT SILK 3-0 18XBRD TIE 12 (SUTURE) IMPLANT
SUT VIC AB 2-0 CT1 27 (SUTURE) ×2
SUT VIC AB 2-0 CT1 TAPERPNT 27 (SUTURE) ×1 IMPLANT
SUT VIC AB 3-0 SH 27 (SUTURE) ×2
SUT VIC AB 3-0 SH 27X BRD (SUTURE) ×1 IMPLANT
SUT VICRYL AB 2 0 TIES (SUTURE) ×3 IMPLANT
SUT VICRYL AB 3 0 TIES (SUTURE) IMPLANT
SYR 20CC LL (SYRINGE) ×3 IMPLANT

## 2017-12-22 NOTE — Anesthesia Postprocedure Evaluation (Signed)
Anesthesia Post Note  Patient: Bobby Tyler  Procedure(s) Performed: HERNIA REPAIR LEFT INGUINAL WITH MESH (Left Groin)  Patient location during evaluation: PACU Anesthesia Type: General Level of consciousness: awake and alert and patient cooperative Vital Signs Assessment: post-procedure vital signs reviewed and stable Respiratory status: spontaneous breathing Cardiovascular status: stable Postop Assessment: no apparent nausea or vomiting Anesthetic complications: no     Last Vitals:  Vitals:   12/22/17 1315 12/22/17 1330  BP:  (!) 169/75  Pulse:  75  Resp:  12  Temp: 36.5 C   SpO2:  100%    Last Pain:  Vitals:   12/22/17 1315  TempSrc:   PainSc: Asleep                 Reno Clasby

## 2017-12-22 NOTE — Anesthesia Preprocedure Evaluation (Signed)
Anesthesia Evaluation  Patient identified by MRN, date of birth, ID band Patient awake    Reviewed: Allergy & Precautions, NPO status , Patient's Chart, lab work & pertinent test results  Airway Mallampati: II  TM Distance: >3 FB Neck ROM: Full    Dental no notable dental hx. (+) Poor Dentition, Edentulous Upper   Pulmonary neg pulmonary ROS, former smoker,    Pulmonary exam normal breath sounds clear to auscultation       Cardiovascular Exercise Tolerance: Poor hypertension, Pt. on medications negative cardio ROS Normal cardiovascular examII Rhythm:Regular Rate:Normal  Denies Cardiac or pulm issues  States ET is limited by groin pain    Neuro/Psych negative neurological ROS  negative psych ROS   GI/Hepatic negative GI ROS, Neg liver ROS, H/o Cirrhosis - doesn't drink  INR ~1.38 Surgeon aware -WTP Belly appears slightly taught - States hasnt taken his diuretic today    Endo/Other  negative endocrine ROS  Renal/GU negative Renal ROSStates is having urinary issues of late with Catheter placements this last month   negative genitourinary   Musculoskeletal negative musculoskeletal ROS (+)   Abdominal   Peds negative pediatric ROS (+)  Hematology negative hematology ROS (+)   Anesthesia Other Findings   Reproductive/Obstetrics negative OB ROS                             Anesthesia Physical Anesthesia Plan  ASA: III  Anesthesia Plan: General   Post-op Pain Management:    Induction: Intravenous  PONV Risk Score and Plan:   Airway Management Planned: Oral ETT  Additional Equipment:   Intra-op Plan:   Post-operative Plan: Extubation in OR  Informed Consent: I have reviewed the patients History and Physical, chart, labs and discussed the procedure including the risks, benefits and alternatives for the proposed anesthesia with the patient or authorized representative who has  indicated his/her understanding and acceptance.   Dental advisory given  Plan Discussed with: CRNA  Anesthesia Plan Comments:         Anesthesia Quick Evaluation

## 2017-12-22 NOTE — Anesthesia Procedure Notes (Signed)
Procedure Name: Intubation Date/Time: 12/22/2017 12:04 PM Performed by: Vista Deck, CRNA Pre-anesthesia Checklist: Patient identified, Patient being monitored, Timeout performed, Emergency Drugs available and Suction available Patient Re-evaluated:Patient Re-evaluated prior to induction Oxygen Delivery Method: Circle System Utilized Preoxygenation: Pre-oxygenation with 100% oxygen Induction Type: IV induction, Rapid sequence and Cricoid Pressure applied Laryngoscope Size: Mac and 3 Grade View: Grade I Tube type: Oral Tube size: 7.0 mm Number of attempts: 1 Airway Equipment and Method: stylet Placement Confirmation: ETT inserted through vocal cords under direct vision,  positive ETCO2 and breath sounds checked- equal and bilateral Secured at: 23 cm Tube secured with: Tape Dental Injury: Teeth and Oropharynx as per pre-operative assessment

## 2017-12-22 NOTE — Interval H&P Note (Signed)
History and Physical Interval Note:  12/22/2017 10:23 AM  Bobby Tyler  has presented today for surgery, with the diagnosis of left inguinal hernia  The various methods of treatment have been discussed with the patient and family. After consideration of risks, benefits and other options for treatment, the patient has consented to  Procedure(s): HERNIA REPAIR LEFT INGUINAL WITH MESH (Left) as a surgical intervention .  The patient's history has been reviewed, patient examined, no change in status, stable for surgery.  I have reviewed the patient's chart and labs.  Questions were answered to the patient's satisfaction.     Aviva Signs

## 2017-12-22 NOTE — Transfer of Care (Signed)
Immediate Anesthesia Transfer of Care Note  Patient: Bobby Tyler  Procedure(s) Performed: HERNIA REPAIR LEFT INGUINAL WITH MESH (Left Groin)  Patient Location: PACU  Anesthesia Type:General  Level of Consciousness: awake and patient cooperative  Airway & Oxygen Therapy: Patient Spontanous Breathing and non-rebreather face mask  Post-op Assessment: Report given to RN and Post -op Vital signs reviewed and stable  Post vital signs: Reviewed and stable  Last Vitals:  Vitals Value Taken Time  BP    Temp    Pulse 83 12/22/2017  1:15 PM  Resp    SpO2 99 % 12/22/2017  1:15 PM  Vitals shown include unvalidated device data.  Last Pain:  Vitals:   12/22/17 1045  TempSrc: Oral  PainSc: 0-No pain      Patients Stated Pain Goal: 8 (71/27/87 [id number])  Complications: No apparent anesthesia complications

## 2017-12-22 NOTE — Discharge Instructions (Signed)
Open Hernia Repair, Adult, Care After These instructions give you information about caring for yourself after your procedure. Your doctor may also give you more specific instructions. If you have problems or questions, contact your doctor. Follow these instructions at home: Surgical cut (incision) care   Follow instructions from your doctor about how to take care of your surgical cut area. Make sure you: ? Wash your hands with soap and water before you change your bandage (dressing). If you cannot use soap and water, use hand sanitizer. ? Change your bandage as told by your doctor. ? Leave stitches (sutures), skin glue, or skin tape (adhesive) strips in place. They may need to stay in place for 2 weeks or longer. If tape strips get loose and curl up, you may trim the loose edges. Do not remove tape strips completely unless your doctor says it is okay.  Check your surgical cut every day for signs of infection. Check for: ? More redness, swelling, or pain. ? More fluid or blood. ? Warmth. ? Pus or a bad smell. Activity  Do not drive or use heavy machinery while taking prescription pain medicine. Do not drive until your doctor says it is okay.  Until your doctor says it is okay: ? Do not lift anything that is heavier than 10 lb (4.5 kg). ? Do not play contact sports.  Return to your normal activities as told by your doctor. Ask your doctor what activities are safe. General instructions  To prevent or treat having a hard time pooping (constipation) while you are taking prescription pain medicine, your doctor may recommend that you: ? Drink enough fluid to keep your pee (urine) clear or pale yellow. ? Take over-the-counter or prescription medicines. ? Eat foods that are high in fiber, such as fresh fruits and vegetables, whole grains, and beans. ? Limit foods that are high in fat and processed sugars, such as fried and sweet foods.  Take over-the-counter and prescription medicines only as  told by your doctor.  Do not take baths, swim, or use a hot tub until your doctor says it is okay.  Keep all follow-up visits as told by your doctor. This is important. Contact a doctor if:  You develop a rash.  You have more redness, swelling, or pain around your surgical cut.  You have more fluid or blood coming from your surgical cut.  Your surgical cut feels warm to the touch.  You have pus or a bad smell coming from your surgical cut.  You have a fever or chills.  You have blood in your poop (stool).  You have not pooped in 2-3 days.  Medicine does not help your pain. Get help right away if:  You have chest pain or you are short of breath.  You feel light-headed.  You feel weak and dizzy (feel faint).  You have very bad pain.  You throw up (vomit) and your pain is worse. This information is not intended to replace advice given to you by your health care provider. Make sure you discuss any questions you have with your health care provider. Document Released: 05/02/2014 Document Revised: 10/30/2015 Document Reviewed: 09/23/2015 Elsevier Interactive Patient Education  2018 [place] POST-ANESTHESIA  IMMEDIATELY FOLLOWING SURGERY:  Do not drive or operate machinery for the first twenty four hours after surgery.  Do not make any important decisions for twenty four hours after surgery or while taking narcotic pain medications or sedatives.  If you develop intractable nausea  and vomiting or a severe headache please notify your doctor immediately.  FOLLOW-UP:  Please make an appointment with your surgeon as instructed. You do not need to follow up with anesthesia unless specifically instructed to do so.  WOUND CARE INSTRUCTIONS (if applicable):  Keep a dry clean dressing on the anesthesia/puncture wound site if there is drainage.  Once the wound has quit draining you may leave it open to air.  Generally you should leave the bandage intact for  twenty four hours unless there is drainage.  If the epidural site drains for more than 36-48 hours please call the anesthesia department.  QUESTIONS?:  Please feel free to call your physician or the hospital operator if you have any questions, and they will be happy to assist you.      Incision Care, Adult An incision is a cut that a doctor makes in your skin for surgery (for a procedure). Most times, these cuts are closed after surgery. Your cut from surgery may be closed with stitches (sutures), staples, skin glue, or skin tape (adhesive strips). You may need to return to your doctor to have stitches or staples taken out. This may happen many days or many weeks after your surgery. The cut needs to be well cared for so it does not get infected. How to care for your cut Cut care  Follow instructions from your doctor about how to take care of your cut. Make sure you: ? Wash your hands with soap and water before you change your bandage (dressing). If you cannot use soap and water, use hand sanitizer. ? Change your bandage as told by your doctor. ? Leave stitches, skin glue, or skin tape in place. They may need to stay in place for 2 weeks or longer. If tape strips get loose and curl up, you may trim the loose edges. Do not remove tape strips completely unless your doctor says it is okay.  Check your cut area every day for signs of infection. Check for: ? More redness, swelling, or pain. ? More fluid or blood. ? Warmth. ? Pus or a bad smell.  Ask your doctor how to clean the cut. This may include: ? Using mild soap and water. ? Using a clean towel to pat the cut dry after you clean it. ? Putting a cream or ointment on the cut. Do this only as told by your doctor. ? Covering the cut with a clean bandage.  Ask your doctor when you can leave the cut uncovered.  Do not take baths, swim, or use a hot tub until your doctor says it is okay. Ask your doctor if you can take showers. You may only be  allowed to take sponge baths for bathing. Medicines  If you were prescribed an antibiotic medicine, cream, or ointment, take the antibiotic or put it on the cut as told by your doctor. Do not stop taking or putting on the antibiotic even if your condition gets better.  Take over-the-counter and prescription medicines only as told by your doctor. General instructions  Limit movement around your cut. This helps healing. ? Avoid straining, lifting, or exercise for the first month, or for as long as told by your doctor. ? Follow instructions from your doctor about going back to your normal activities. ? Ask your doctor what activities are safe.  Protect your cut from the sun when you are outside for the first 6 months, or for as long as told by your doctor. Put on  sunscreen around the scar or cover up the scar.  Keep all follow-up visits as told by your doctor. This is important. Contact a doctor if:  Your have more redness, swelling, or pain around the cut.  You have more fluid or blood coming from the cut.  Your cut feels warm to the touch.  You have pus or a bad smell coming from the cut.  You have a fever or shaking chills.  You feel sick to your stomach (nauseous) or you throw up (vomit).  You are dizzy.  Your stitches or staples come undone. Get help right away if:  You have a red streak coming from your cut.  Your cut bleeds through the bandage and the bleeding does not stop with gentle pressure.  The edges of your cut open up and separate.  You have very bad (severe) pain.  You have a rash.  You are confused.  You pass out (faint).  You have trouble breathing and you have a fast heartbeat. This information is not intended to replace advice given to you by your health care provider. Make sure you discuss any questions you have with your health care provider. Document Released: 07/04/2011 Document Revised: 12/18/2015 Document Reviewed: 12/18/2015 Elsevier  Interactive Patient Education  2017 [employer].   Oxycodone tablets or capsules What is this medicine? OXYCODONE (ox i KOE done) is a pain reliever. It is used to treat moderate to severe pain. This medicine may be used for other purposes; ask your health care provider or pharmacist if you have questions. COMMON BRAND NAME(S): Dazidox, Endocodone, Oxaydo, OXECTA, OxyIR, Percolone, Roxicodone, ROXYBOND What should I tell my health care provider before I take this medicine? They need to know if you have any of these conditions: -Addison's disease -brain tumor -head injury -heart disease -history of drug or alcohol abuse problem -if you often drink alcohol -kidney disease -liver disease -lung or breathing disease, like asthma -mental illness -pancreatic disease -seizures -thyroid disease -an unusual or allergic reaction to oxycodone, codeine, hydrocodone, morphine, other medicines, foods, dyes, or preservatives -pregnant or trying to get pregnant -breast-feeding How should I use this medicine? Take this medicine by mouth with a glass of water. Follow the directions on the prescription label. You can take it with or without food. If it upsets your stomach, take it with food. Take your medicine at regular intervals. Do not take it more often than directed. Do not stop taking except on your doctor's advice. Some brands of this medicine, like Oxecta, have special instructions. Ask your doctor or pharmacist if these directions are for you: Do not cut, crush or chew this medicine. Swallow only one tablet at a time. Do not wet, soak, or lick the tablet before you take it. A special MedGuide will be given to you by the pharmacist with each prescription and refill. Be sure to read this information carefully each time. Talk to your pediatrician regarding the use of this medicine in children. Special care may be needed. Overdosage: If you think you have taken too much of this medicine contact a  poison control center or emergency room at once. NOTE: This medicine is only for you. Do not share this medicine with others. What if I miss a dose? If you miss a dose, take it as soon as you can. If it is almost time for your next dose, take only that dose. Do not take double or extra doses. What may interact with this medicine? This medicine may interact  with the following medications: -alcohol -antihistamines for allergy, cough and cold -antiviral medicines for HIV or AIDS -atropine -certain antibiotics like clarithromycin, erythromycin, linezolid, rifampin -certain medicines for anxiety or sleep -certain medicines for bladder problems like oxybutynin, tolterodine -certain medicines for depression like amitriptyline, fluoxetine, sertraline -certain medicines for fungal infections like ketoconazole, itraconazole, voriconazole -certain medicines for migraine headache like almotriptan, eletriptan, frovatriptan, naratriptan, rizatriptan, sumatriptan, zolmitriptan -certain medicines for nausea or vomiting like dolasetron, ondansetron, palonosetron -certain medicines for Parkinson's disease like benztropine, trihexyphenidyl -certain medicines for seizures like phenobarbital, phenytoin, primidone -certain medicines for stomach problems like dicyclomine, hyoscyamine -certain medicines for travel sickness like scopolamine -diuretics -general anesthetics like halothane, isoflurane, methoxyflurane, propofol -ipratropium -local anesthetics like lidocaine, pramoxine, tetracaine -MAOIs like Carbex, Eldepryl, Marplan, Nardil, and Parnate -medicines that relax muscles for surgery -methylene blue -nilotinib -other narcotic medicines for pain or cough -phenothiazines like chlorpromazine, mesoridazine, prochlorperazine, thioridazine This list may not describe all possible interactions. Give your health care provider a list of all the medicines, herbs, non-prescription drugs, or dietary supplements you  use. Also tell them if you smoke, drink alcohol, or use illegal drugs. Some items may interact with your medicine. What should I watch for while using this medicine? Tell your doctor or health care professional if your pain does not go away, if it gets worse, or if you have new or a different type of pain. You may develop tolerance to the medicine. Tolerance means that you will need a higher dose of the medicine for pain relief. Tolerance is normal and is expected if you take this medicine for a long time. Do not suddenly stop taking your medicine because you may develop a severe reaction. Your body becomes used to the medicine. This does NOT mean you are addicted. Addiction is a behavior related to getting and using a drug for a non-medical reason. If you have pain, you have a medical reason to take pain medicine. Your doctor will tell you how much medicine to take. If your doctor wants you to stop the medicine, the dose will be slowly lowered over time to avoid any side effects. There are different types of narcotic medicines (opiates). If you take more than one type at the same time or if you are taking another medicine that also causes drowsiness, you may have more side effects. Give your health care provider a list of all medicines you use. Your doctor will tell you how much medicine to take. Do not take more medicine than directed. Call emergency for help if you have problems breathing or unusual sleepiness. You may get drowsy or dizzy. Do not drive, use machinery, or do anything that needs mental alertness until you know how the medicine affects you. Do not stand or sit up quickly, especially if you are an older patient. This reduces the risk of dizzy or fainting spells. Alcohol may interfere with the effect of this medicine. Avoid alcoholic drinks. This medicine will cause constipation. Try to have a bowel movement at least every 2 to 3 days. If you do not have a bowel movement for 3 days, call your  doctor or health care professional. Your mouth may get dry. Chewing sugarless gum or sucking hard candy, and drinking plenty of water may help. Contact your doctor if the problem does not go away or is severe. What side effects may I notice from receiving this medicine? Side effects that you should report to your doctor or health care professional as soon as possible: -allergic  reactions like skin rash, itching or hives, swelling of the face, lips, or tongue -breathing problems -confusion -signs and symptoms of low blood pressure like dizziness; feeling faint or lightheaded, falls; unusually weak or tired -trouble passing urine or change in the amount of urine -trouble swallowing Side effects that usually do not require medical attention (report to your doctor or health care professional if they continue or are bothersome): -constipation -dry mouth -nausea, vomiting -tiredness This list may not describe all possible side effects. Call your doctor for medical advice about side effects. You may report side effects to FDA at 1-800-FDA-1088. Where should I keep my medicine? Keep out of the reach of children. This medicine can be abused. Keep your medicine in a safe place to protect it from theft. Do not share this medicine with anyone. Selling or giving away this medicine is dangerous and against the law. Store at room temperature between 15 and 30 degrees C (59 and 86 degrees F). Protect from light. Keep container tightly closed. This medicine may cause accidental overdose and death if it is taken by other adults, children, or pets. Flush any unused medicine down the toilet to reduce the chance of harm. Do not use the medicine after the expiration date. NOTE: This sheet is a summary. It may not cover all possible information. If you have questions about this medicine, talk to your doctor, pharmacist, or health care provider.  2018 Elsevier/[employer] Standard (2015-02-24 16:55:57)

## 2017-12-22 NOTE — Op Note (Signed)
Patient:  Bobby Tyler  DOB:  [birth year]  MRN:  [id number]   Preop Diagnosis: Left inguinal hernia  Postop Diagnosis: Same, left hydrocele  Procedure: Left inguinal herniorrhaphy with mesh  Surgeon: Aviva Signs, MD  Anes: General  Indications: Patient is an 82 year old white male with multiple medical problems including early cirrhosis who presents with a symptomatic left inguinal hernia.  The risks and benefits of the procedure including bleeding, infection, cardiopulmonary difficulties, and the possibility of recurrence of the hernia were fully explained to the patient, who gave informed consent.  Procedure note: The patient was placed in the supine position.  After general anesthesia was administered, the left groin region was prepped and draped using usual sterile technique with technic care.  Surgical site confirmation was performed.  An incision was made in the left groin region down to the external Bleich aponeurosis.  The aponeurosis was incised to the external ring.  The surrounding soft tissues may be edematous.  The ileal inguinal nerve was identified and retracted superiorly from the operative field.  The patient had a large indirect hernia which contained bowel.  This was freed away from the spermatic cord and reduced.  The patient also had a residual hydrocele which was drained and opened.  The vase deferens was noted within the spermatic cord.  A large Bard PerFix plug was then placed in the indirect hernia.  The patient also had a smaller direct hernia component and this was repaired using a medium sized Bard PerFix plug.  An onlay patch was then placed along the floor of the anal canal and secured superiorly to the conjoined tendon and inferiorly to the shelving edge of Poupart's ligament using 2-0 Novafil interrupted sutures.  The internal ring was re-created using a 2-0 Novafil interrupted suture.  The external Bleich aponeurosis was reapproximated using a 2-0 Vicryl  running suture.  Subcutaneous layer was reapproximated using a 3-0 Vicryl interrupted suture.  Exparel was instilled into the surrounding wound.  The skin was closed using staples.  Betadine ointment and dry sterile dressing were applied.  A straight cath was performed prior to the patient waking up.  All tape and needle counts were correct at the end of the procedure.  Patient was awakened and transferred to PACU in stable condition.  Complications: None  EBL: Minimal  Specimen: None

## 2017-12-30 ENCOUNTER — Other Ambulatory Visit: Payer: Self-pay | Admitting: General Surgery

## 2018-01-04 ENCOUNTER — Ambulatory Visit: Payer: Self-pay | Admitting: General Surgery

## 2018-01-04 ENCOUNTER — Encounter: Payer: Self-pay | Admitting: General Surgery

## 2018-01-04 ENCOUNTER — Ambulatory Visit (INDEPENDENT_AMBULATORY_CARE_PROVIDER_SITE_OTHER): Payer: Self-pay | Admitting: General Surgery

## 2018-01-04 VITALS — BP 171/98 | HR 105 | Temp 98.6°F | Resp 24 | Wt 218.0 lb

## 2018-01-04 DIAGNOSIS — Z09 Encounter for follow-up examination after completed treatment for conditions other than malignant neoplasm: Secondary | ICD-10-CM

## 2018-01-04 NOTE — Progress Notes (Signed)
Subjective:     Bobby Tyler  Status post left inguinal herniorrhaphy with mesh.  Does have some swelling in the left groin region.  He is more concerned about his abdomen being distended with ascites and his umbilical hernia.  He has not been taking his medications as previously prescribed. Objective:    BP (!) 171/98 (BP Location: Left Arm, Patient Position: Sitting, Cuff Size: Normal)   Pulse (!) 105   Temp 98.6 F (37 C) (Temporal)   Resp (!) 24   Wt 218 lb (98.9 kg)   BMI 31.28 kg/m   General:  alert, cooperative and no distress  Left inguinal incision healing well.  20 cc seroma aspirated.  Staples removed, Steri-Strips applied.  Abdomen is distended and somewhat tense with a fluid wave present.  An umbilical hernia is present.  Is not incarcerated.     Assessment:    Doing well postoperatively. Decompensated liver function with ascites    Plan:   I told patient that he needs to contact GI for follow-up care and to get his ascites under control.  He understands and will make that call.  Follow-up here as needed.

## 2018-01-09 ENCOUNTER — Ambulatory Visit: Payer: Medicare HMO | Admitting: Gastroenterology

## 2018-01-09 ENCOUNTER — Encounter: Payer: Self-pay | Admitting: Gastroenterology

## 2018-01-09 VITALS — BP 161/77 | HR 96 | Temp 98.1°F | Ht 70.0 in | Wt 210.2 lb

## 2018-01-09 DIAGNOSIS — K5900 Constipation, unspecified: Secondary | ICD-10-CM | POA: Diagnosis not present

## 2018-01-09 DIAGNOSIS — K7460 Unspecified cirrhosis of liver: Secondary | ICD-10-CM | POA: Diagnosis not present

## 2018-01-09 DIAGNOSIS — R188 Other ascites: Secondary | ICD-10-CM | POA: Diagnosis not present

## 2018-01-09 MED ORDER — FUROSEMIDE 20 MG PO TABS: 20.0000 mg | ORAL_TABLET | Freq: Every day | ORAL | 3 refills | Status: AC

## 2018-01-09 MED ORDER — SPIRONOLACTONE 50 MG PO TABS: 50.0000 mg | ORAL_TABLET | Freq: Every day | ORAL | 3 refills | Status: AC

## 2018-01-09 NOTE — Patient Instructions (Addendum)
I would like for you to start taking lasix 20 milligrams and aldactone 50 milligrams once each morning. Please have blood work done in 1 week. I included the labs today for you to take.   I have ordered a paracentesis to remove fluid from your belly. On the day you have that, don't take any fluid pills.  It's important to stick with a low salt, 2 gram sodium diet. I included a handout.   For constipation: you can take Miralax each evening as needed for a bowel movement.  Do not take Ibuprofen, Motril, Advil, Aleve. You can take tylenol in low doses, but no more than 2 grams a day. I would keep it at 1,000 milligrams (1 gram) per 24 hour period if needed.   I will see you in 4-6 weeks, and we can arrange the upper endoscopy.  Please call with any concerns in the meantime!  I enjoyed seeing you again today! As you know, I value our relationship and want to provide genuine, compassionate, and quality care. I welcome your feedback. If you receive a survey regarding your visit,  I greatly appreciate you taking time to fill this out. See you next time!  Annitta Needs, PhD, ANP-BC [facility]   Low-Sodium Eating Plan Sodium, which is an element that makes up salt, helps you maintain a healthy balance of fluids in your body. Too much sodium can increase your blood pressure and cause fluid and waste to be held in your body. Your health care provider or dietitian may recommend following this plan if you have high blood pressure (hypertension), kidney disease, liver disease, or heart failure. Eating less sodium can help lower your blood pressure, reduce swelling, and protect your heart, liver, and kidneys. What are tips for following this plan? General guidelines  Most people on this plan should limit their sodium intake to 1,500-2,000 mg (milligrams) of sodium each day. Reading food labels  The Nutrition Facts label lists the amount of sodium in one serving of the food. If you eat  more than one serving, you must multiply the listed amount of sodium by the number of servings.  Choose foods with less than 140 mg of sodium per serving.  Avoid foods with 300 mg of sodium or more per serving. Shopping  Look for lower-sodium products, often labeled as "low-sodium" or "no salt added."  Always check the sodium content even if foods are labeled as "unsalted" or "no salt added".  Buy fresh foods. ? Avoid canned foods and premade or frozen meals. ? Avoid canned, cured, or processed meats  Buy breads that have less than 80 mg of sodium per slice. Cooking  Eat more home-cooked food and less restaurant, buffet, and fast food.  Avoid adding salt when cooking. Use salt-free seasonings or herbs instead of table salt or sea salt. Check with your health care provider or pharmacist before using salt substitutes.  Cook with plant-based oils, such as canola, sunflower, or olive oil. Meal planning  When eating at a restaurant, ask that your food be prepared with less salt or no salt, if possible.  Avoid foods that contain MSG (monosodium glutamate). MSG is sometimes added to [place] food, bouillon, and some canned foods. What foods are recommended? The items listed may not be a complete list. Talk with your dietitian about what dietary choices are best for you. Grains Low-sodium cereals, including oats, puffed wheat and rice, and shredded wheat. Low-sodium crackers. Unsalted rice. Unsalted pasta. Low-sodium bread. Whole-grain breads  and whole-grain pasta. Vegetables Fresh or frozen vegetables. "No salt added" canned vegetables. "No salt added" tomato sauce and paste. Low-sodium or reduced-sodium tomato and vegetable juice. Fruits Fresh, frozen, or canned fruit. Fruit juice. Meats and other protein foods Fresh or frozen (no salt added) meat, poultry, seafood, and fish. Low-sodium canned tuna and salmon. Unsalted nuts. Dried peas, beans, and lentils without added salt. Unsalted  canned beans. Eggs. Unsalted nut butters. Dairy Milk. Soy milk. Cheese that is naturally low in sodium, such as ricotta cheese, fresh mozzarella, or Swiss cheese Low-sodium or reduced-sodium cheese. Cream cheese. Yogurt. Fats and oils Unsalted butter. Unsalted margarine with no trans fat. Vegetable oils such as canola or olive oils. Seasonings and other foods Fresh and dried herbs and spices. Salt-free seasonings. Low-sodium mustard and ketchup. Sodium-free salad dressing. Sodium-free light mayonnaise. Fresh or refrigerated horseradish. Lemon juice. Vinegar. Homemade, reduced-sodium, or low-sodium soups. Unsalted popcorn and pretzels. Low-salt or salt-free chips. What foods are not recommended? The items listed may not be a complete list. Talk with your dietitian about what dietary choices are best for you. Grains Instant hot cereals. Bread stuffing, pancake, and biscuit mixes. Croutons. Seasoned rice or pasta mixes. Noodle soup cups. Boxed or frozen macaroni and cheese. Regular salted crackers. Self-rising flour. Vegetables Sauerkraut, pickled vegetables, and relishes. Olives. [place] fries. Onion rings. Regular canned vegetables (not low-sodium or reduced-sodium). Regular canned tomato sauce and paste (not low-sodium or reduced-sodium). Regular tomato and vegetable juice (not low-sodium or reduced-sodium). Frozen vegetables in sauces. Meats and other protein foods Meat or fish that is salted, canned, smoked, spiced, or pickled. Bacon, ham, sausage, hotdogs, corned beef, chipped beef, packaged lunch meats, salt pork, jerky, pickled herring, anchovies, regular canned tuna, sardines, salted nuts. Dairy Processed cheese and cheese spreads. Cheese curds. Blue cheese. Feta cheese. String cheese. Regular cottage cheese. Buttermilk. Canned milk. Fats and oils Salted butter. Regular margarine. Ghee. Bacon fat. Seasonings and other foods Onion salt, garlic salt, seasoned salt, table salt, and sea salt.  Canned and packaged gravies. Worcestershire sauce. Tartar sauce. Barbecue sauce. Teriyaki sauce. Soy sauce, including reduced-sodium. Steak sauce. Fish sauce. Oyster sauce. Cocktail sauce. Horseradish that you find on the shelf. Regular ketchup and mustard. Meat flavorings and tenderizers. Bouillon cubes. Hot sauce and Tabasco sauce. Premade or packaged marinades. Premade or packaged taco seasonings. Relishes. Regular salad dressings. Salsa. Potato and tortilla chips. Corn chips and puffs. Salted popcorn and pretzels. Canned or dried soups. Pizza. Frozen entrees and pot pies. Summary  Eating less sodium can help lower your blood pressure, reduce swelling, and protect your heart, liver, and kidneys.  Most people on this plan should limit their sodium intake to 1,500-2,000 mg (milligrams) of sodium each day.  Canned, boxed, and frozen foods are high in sodium. Restaurant foods, fast foods, and pizza are also very high in sodium. You also get sodium by adding salt to food.  Try to cook at home, eat more fresh fruits and vegetables, and eat less fast food, canned, processed, or prepared foods. This information is not intended to replace advice given to you by your health care provider. Make sure you discuss any questions you have with your health care provider. Document Released: 10/01/2001 Document Revised: 04/04/2016 Document Reviewed: 04/04/2016 Elsevier Interactive Patient Education  [employer].

## 2018-01-09 NOTE — Progress Notes (Signed)
Referring Provider: No ref. provider found Primary Care Physician:  System, Pcp Not In Primary GI: Dr. Gala Romney   Chief Complaint  Patient presents with  . Cirrhosis    f/u. Abd/feet swelling    HPI:   Bobby Tyler is a 82 y.o. male presenting today with a history of cirrhosis, thorough labs thus far and noted to have elevated ferritin and sats. 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 (positive for one copy of the C282Y but negative for H63D mutation). Needs updated iron studies.    Last seen in July 2019 with scrotal edema, evaluated with ultrasound, referred to urology, underwent left inguinal hernia repair with mesh 12/22/17. Presents today stating he has worsening lower extremity edema, abdominal distension. No abdominal pain. Feels constipated. Was on hydrocodone but now on Ibuprofen. Likes pickles. Not strictly following a low salt diet. Not on diuretics currently.   Wife and son present with him today. No confusion or mental status changes.   Needs EGD for variceal screening.   Past Medical History:  Diagnosis Date  . Cirrhosis ([place]) 08/2017  . HOH (hard of hearing)   . Hypertension     Past Surgical History:  Procedure Laterality Date  . INGUINAL HERNIA REPAIR Left 12/22/2017   Procedure: HERNIA REPAIR LEFT INGUINAL WITH MESH;  Surgeon: Aviva Signs, MD;  Location: [facility];  Service: General;  Laterality: Left;  . none      Current Outpatient Medications  Medication Sig Dispense Refill  . ibuprofen (ADVIL,MOTRIN) 200 MG tablet Take 200 mg by mouth every 6 (six) hours as needed (for pain.).     Marland Kitchen furosemide (LASIX) 20 MG tablet Take 1 tablet (20 mg total) by mouth daily. (Patient not taking: Reported on 01/09/2018) 10 tablet 0  . oxyCODONE (ROXICODONE) 5 MG immediate release tablet Take 1 tablet (5 mg total) by mouth every 6 (six) hours as needed. (Patient not taking: Reported on 01/09/2018) 25 tablet 0  . spironolactone (ALDACTONE) 25 MG tablet Take  1 tablet (25 mg total) by mouth 2 (two) times daily. (Patient not taking: Reported on 01/09/2018) 20 tablet 0   No current facility-administered medications for this visit.     Allergies as of 01/09/2018  . (No Known Allergies)    Family History  Problem Relation Age of Onset  . Liver cancer Neg Hx   . Cirrhosis Neg Hx   . Colon cancer Neg Hx     Social History   Socioeconomic History  . Marital status: Married    Spouse name: Not on file  . Number of children: Not on file  . Years of education: Not on file  . Highest education level: Not on file  Occupational History  . Not on file  Social Needs  . Financial resource strain: Not on file  . Food insecurity:    Worry: Not on file    Inability: Not on file  . Transportation needs:    Medical: Not on file    Non-medical: Not on file  Tobacco Use  . Smoking status: Former Smoker    Packs/day: 1.00    Years: 40.00    Pack years: 40.00    Types: Cigarettes    Last attempt to quit: 1985    Years since quitting: 34.7  . Smokeless tobacco: Never Used  Substance and Sexual Activity  . Alcohol use: Not Currently    Comment: never used regularly/heavily  . Drug use: Not Currently  . Sexual activity: Not  Currently    Birth control/protection: None  Lifestyle  . Physical activity:    Days per week: Not on file    Minutes per session: Not on file  . Stress: Not on file  Relationships  . Social connections:    Talks on phone: Not on file    Gets together: Not on file    Attends religious service: Not on file    Active member of club or organization: Not on file    Attends meetings of clubs or organizations: Not on file    Relationship status: Not on file  Other Topics Concern  . Not on file  Social History Narrative  . Not on file    Review of Systems: As mentioned in HPI   Physical Exam: BP (!) 161/77   Pulse 96   Temp 98.1 F (36.7 C) (Oral)   Ht 5' 10"  (1.778 m)   Wt 210 lb 3.2 oz (95.3 kg)   BMI 30.16  kg/m  General:   Alert and oriented. No distress noted. Pleasant and cooperative.  Head:  Normocephalic and atraumatic. Eyes:  Conjuctiva clear without scleral icterus. Mouth:  Oral mucosa pink and moist. Good dentition. No lesions. Abdomen:  +BS, tense ascites, protruding umbilical hernia but soft, no TTP Msk:  Symmetrical without gross deformities. Normal posture. Extremities:  3+ pitting edema to thigh  Neurologic:  Alert and  oriented x4 Psych:  Alert and cooperative. Normal mood and affect.

## 2018-01-10 ENCOUNTER — Ambulatory Visit (HOSPITAL_COMMUNITY)
Admission: RE | Admit: 2018-01-10 | Discharge: 2018-01-10 | Disposition: A | Discharge status: Home / Self Care | Payer: Medicare HMO | Source: Ambulatory Visit | Attending: Gastroenterology | Admitting: Gastroenterology

## 2018-01-10 ENCOUNTER — Encounter (HOSPITAL_COMMUNITY): Payer: Self-pay

## 2018-01-10 DIAGNOSIS — K7460 Unspecified cirrhosis of liver: Secondary | ICD-10-CM | POA: Diagnosis not present

## 2018-01-10 DIAGNOSIS — R188 Other ascites: Secondary | ICD-10-CM | POA: Diagnosis not present

## 2018-01-10 LAB — BODY FLUID CELL COUNT WITH DIFFERENTIAL
Eos, Fluid: 0 %
Lymphs, Fluid: 67 %
Monocyte-Macrophage-Serous Fluid: 22 % — ABNORMAL LOW (ref 50–90)
Neutrophil Count, Fluid: 11 % (ref 0–25)
Other Cells, Fluid: REACTIVE %
WBC FLUID: 138 uL (ref 0–1000)

## 2018-01-10 LAB — GRAM STAIN

## 2018-01-10 NOTE — Progress Notes (Signed)
Paracentesis complete no signs of distress.

## 2018-01-11 LAB — ACID FAST SMEAR (AFB, MYCOBACTERIA): Acid Fast Smear: NEGATIVE

## 2018-01-11 LAB — ACID FAST SMEAR (AFB)

## 2018-01-15 ENCOUNTER — Telehealth: Payer: Self-pay | Admitting: Gastroenterology

## 2018-01-15 ENCOUNTER — Other Ambulatory Visit: Payer: Self-pay | Admitting: *Deleted

## 2018-01-15 ENCOUNTER — Telehealth: Payer: Self-pay

## 2018-01-15 DIAGNOSIS — K7460 Unspecified cirrhosis of liver: Secondary | ICD-10-CM

## 2018-01-15 DIAGNOSIS — R188 Other ascites: Principal | ICD-10-CM

## 2018-01-15 DIAGNOSIS — K5900 Constipation, unspecified: Secondary | ICD-10-CM | POA: Insufficient documentation

## 2018-01-15 LAB — CULTURE, BODY FLUID W GRAM STAIN -BOTTLE

## 2018-01-15 LAB — CULTURE, BODY FLUID-BOTTLE: CULTURE: NO GROWTH

## 2018-01-15 MED ORDER — ALBUMIN HUMAN 25 % IV SOLN: 25.0000 g | Freq: Once | INTRAVENOUS | Status: AC

## 2018-01-15 NOTE — Assessment & Plan Note (Signed)
82 year old very pleasant male with cirrhosis suspected secondary to NASH; however, he does have elevated ferritin and sats, with hemochromatosis labs on file and heterozygous for the C282Y mutation, negative for H63D mutation. Needs updated iron studies and likely referred to Hematology. Now with recurrent tense ascites but no prior paracentesis. No concern for SBP but will complete fluid analysis. Likely decompensation secondary to increased salt intake. I will also order updated imaging to evaluate for any PVT. Resume lasix 20 mg and aldactone 50 mg; we will check labs in 1 week to ensure he is tolerating this. May need to titrate this up if he can tolerate it.  I had recommended labs today to ensure renal function stable, but he has declined. Will check in 1 week. 2 gram Na diet handout provided. Return in 4-6 weeks to arrange EGD.

## 2018-01-15 NOTE — Telephone Encounter (Signed)
Spoke with Rich in Radiology. Patient could have this done today but would be waiting for several hours.  Called patient and he prefers to wait until tomorrow. Appt scheduled for 10:00am on 01/16/18, arrival time 9:45am. Patient aware do no take any diuretics tomorrow.

## 2018-01-15 NOTE — Telephone Encounter (Signed)
Pt called and wants to have a paracentesis today. Pt has a lot of fluid in his abdomen and states his belly button is swollen.

## 2018-01-15 NOTE — Telephone Encounter (Signed)
Please have patient get blood work done when he gets his paracentesis today. (CBC with diff, BMP, HFP, INR, iron studies). He should still have lab orders.  Is he taking the lasix 20 mg and aldactone 50 mg daily? We will likely titrate this up but I need the labs done first before doing so.  Can arrange [place] para today with 25 grams IV albumin at 4 liters. No diuretics on day of para. Very important we get blood work today.

## 2018-01-15 NOTE — Telephone Encounter (Signed)
Please let patient know I feel we need to update imaging of his liver. Please arrange ultrasound of liver with doppler study to evaluate for portal vein thrombosis.

## 2018-01-15 NOTE — Assessment & Plan Note (Addendum)
In setting of recent narcotics. Start Miralax once daily.

## 2018-01-15 NOTE — Telephone Encounter (Signed)
Pt is taking the Lasix 20 mg and the Aldactone 50 mg daily. Pt will have his labs drawn today as well.

## 2018-01-16 ENCOUNTER — Ambulatory Visit (HOSPITAL_COMMUNITY)
Admission: RE | Admit: 2018-01-16 | Discharge: 2018-01-16 | Disposition: A | Discharge status: Home / Self Care | Payer: Medicare HMO | Source: Ambulatory Visit | Attending: Gastroenterology | Admitting: Gastroenterology

## 2018-01-16 ENCOUNTER — Encounter (HOSPITAL_COMMUNITY): Payer: Self-pay

## 2018-01-16 DIAGNOSIS — R188 Other ascites: Secondary | ICD-10-CM | POA: Diagnosis not present

## 2018-01-16 DIAGNOSIS — K7460 Unspecified cirrhosis of liver: Secondary | ICD-10-CM

## 2018-01-16 MED ORDER — ALBUMIN HUMAN 25 % IV SOLN
INTRAVENOUS | Status: AC
  Administered 2018-01-16: 25 g via INTRAVENOUS
  Filled 2018-01-16: qty 100

## 2018-01-16 MED ORDER — ALBUMIN HUMAN 25 % IV SOLN
25.0000 g | Freq: Once | INTRAVENOUS | Status: AC
  Administered 2018-01-16: 25 g via INTRAVENOUS

## 2018-01-16 NOTE — Telephone Encounter (Signed)
Tried calling pt. Phone call dropped.

## 2018-01-16 NOTE — Procedures (Signed)
PreOperative Dx: Cirrhosis, ascites Postoperative Dx: Cirrhosis, ascites Procedure:   US guided paracentesis Radiologist:  Thornton Papas Anesthesia:  10 ml of1% lidocaine Specimen:  6.7 L of yellow ascitic fluid EBL:   < 1 ml Complications: None

## 2018-01-16 NOTE — Progress Notes (Signed)
Negative SBP.

## 2018-01-16 NOTE — Telephone Encounter (Signed)
Patient needs labs done ASAP. I believe he was going to go yesterday, but I don't see where they are pending.

## 2018-01-16 NOTE — Telephone Encounter (Signed)
Called patient and was advised he was NA. Will call back

## 2018-01-16 NOTE — Progress Notes (Signed)
NO PCP

## 2018-01-16 NOTE — Telephone Encounter (Signed)
Patient called back and is aware of below. He was fine with scheduling U/S w/ doppler.  Called central scheduling and ultrasounds scheduled for 01/23/18 at 8:30am, arrival time 8:15am, npo after midnight.   Patient aware of appt details.

## 2018-01-16 NOTE — Telephone Encounter (Signed)
Pt will have labs drawn tomorrow.

## 2018-01-16 NOTE — Telephone Encounter (Signed)
Routing message to AB

## 2018-01-16 NOTE — Progress Notes (Signed)
Paracentesis complete no signs of distress.

## 2018-01-17 ENCOUNTER — Other Ambulatory Visit (HOSPITAL_COMMUNITY): Payer: Medicare HMO

## 2018-01-17 DIAGNOSIS — R188 Other ascites: Secondary | ICD-10-CM | POA: Diagnosis not present

## 2018-01-17 DIAGNOSIS — K7460 Unspecified cirrhosis of liver: Secondary | ICD-10-CM | POA: Diagnosis not present

## 2018-01-18 ENCOUNTER — Other Ambulatory Visit: Payer: Self-pay

## 2018-01-18 DIAGNOSIS — K7460 Unspecified cirrhosis of liver: Secondary | ICD-10-CM

## 2018-01-18 LAB — BASIC METABOLIC PANEL WITH GFR
BUN/Creatinine Ratio: 17 (calc) (ref 6–22)
BUN: 24 mg/dL (ref 7–25)
CALCIUM: 8.6 mg/dL (ref 8.6–10.3)
CO2: 30 mmol/L (ref 20–32)
CREATININE: 1.41 mg/dL — AB (ref 0.70–1.11)
Chloride: 98 mmol/L (ref 98–110)
GFR, Est African American: 54 mL/min/{1.73_m2} — ABNORMAL LOW (ref >=60)
GFR, Est Non African American: 47 mL/min/{1.73_m2} — ABNORMAL LOW (ref >=60)
Glucose, Bld: 105 mg/dL (ref 65–139)
Potassium: 4.7 mmol/L (ref 3.5–5.3)
Sodium: 134 mmol/L — ABNORMAL LOW (ref 135–146)

## 2018-01-18 LAB — CBC WITH DIFFERENTIAL/PLATELET
BASOS PCT: 0.9 %
Basophils Absolute: 93 cells/uL (ref 0–200)
EOS ABS: 237 {cells}/uL (ref 15–500)
Eosinophils Relative: 2.3 %
HCT: 31.7 % — ABNORMAL LOW (ref 38.5–50.0)
HEMOGLOBIN: 11.2 g/dL — AB (ref 13.2–17.1)
LYMPHS ABS: 1566 {cells}/uL (ref 850–3900)
MCH: 32.8 pg (ref 27.0–33.0)
MCHC: 35.3 g/dL (ref 32.0–36.0)
MCV: 93 fL (ref 80.0–100.0)
MPV: 10.9 fL (ref 7.5–12.5)
Monocytes Relative: 12.6 %
NEUTROS ABS: 7107 {cells}/uL (ref 1500–7800)
Neutrophils Relative %: 69 %
Platelets: 125 10*3/uL — ABNORMAL LOW (ref 140–400)
RBC: 3.41 10*6/uL — ABNORMAL LOW (ref 4.20–5.80)
RDW: 13.3 % (ref 11.0–15.0)
Total Lymphocyte: 15.2 %
WBC: 10.3 10*3/uL (ref 3.8–10.8)
WBCMIX: 1298 {cells}/uL — AB (ref 200–950)

## 2018-01-18 LAB — PROTIME-INR
INR: 1.6 — AB
PROTHROMBIN TIME: 16.7 s — AB (ref 9.0–11.5)

## 2018-01-18 LAB — IRON,TIBC AND FERRITIN PANEL
%SAT: 92 % — AB (ref 20–48)
FERRITIN: 770 ng/mL — AB (ref 24–380)
Iron: 108 ug/dL (ref 50–180)
TIBC: 118 ug/dL — AB (ref 250–425)

## 2018-01-18 LAB — HEPATIC FUNCTION PANEL
AG RATIO: 0.7 (calc) — AB (ref 1.0–2.5)
ALBUMIN MSPROF: 2.7 g/dL — AB (ref 3.6–5.1)
ALT: 28 U/L (ref 9–46)
AST: 54 U/L — ABNORMAL HIGH (ref 10–35)
Alkaline phosphatase (APISO): 97 U/L (ref 40–115)
BILIRUBIN DIRECT: 0.8 mg/dL — AB (ref 0.0–0.2)
GLOBULIN: 3.9 g/dL — AB (ref 1.9–3.7)
Indirect Bilirubin: 1.6 mg/dL (calc) — ABNORMAL HIGH (ref 0.2–1.2)
Total Bilirubin: 2.4 mg/dL — ABNORMAL HIGH (ref 0.2–1.2)
Total Protein: 6.6 g/dL (ref 6.1–8.1)

## 2018-01-18 NOTE — Progress Notes (Signed)
MELD Na is 22. His renal function has deteriorated slightly. It fluctuates and he may be coming near his new baseline. Let's keep him on the low dose lasix 20 mg and aldactone 50 mg and recheck BMP next Monday. His INR is creeping up, LFTs fluctuating. Keep [place] as planned.

## 2018-01-22 DIAGNOSIS — K7460 Unspecified cirrhosis of liver: Secondary | ICD-10-CM | POA: Diagnosis not present

## 2018-01-22 LAB — BASIC METABOLIC PANEL
BUN/Creatinine Ratio: 20 (calc) (ref 6–22)
BUN: 33 mg/dL — ABNORMAL HIGH (ref 7–25)
CO2: 27 mmol/L (ref 20–32)
Calcium: 8.4 mg/dL — ABNORMAL LOW (ref 8.6–10.3)
Chloride: 99 mmol/L (ref 98–110)
Creat: 1.62 mg/dL — ABNORMAL HIGH (ref 0.70–1.11)
Glucose, Bld: 119 mg/dL (ref 65–139)
POTASSIUM: 5.4 mmol/L — AB (ref 3.5–5.3)
Sodium: 133 mmol/L — ABNORMAL LOW (ref 135–146)

## 2018-01-23 ENCOUNTER — Other Ambulatory Visit: Payer: Self-pay

## 2018-01-23 ENCOUNTER — Ambulatory Visit (HOSPITAL_COMMUNITY)
Admission: RE | Admit: 2018-01-23 | Discharge: 2018-01-23 | Disposition: A | Discharge status: Home / Self Care | Payer: Medicare HMO | Source: Ambulatory Visit | Attending: Gastroenterology | Admitting: Gastroenterology

## 2018-01-23 DIAGNOSIS — K8020 Calculus of gallbladder without cholecystitis without obstruction: Secondary | ICD-10-CM | POA: Diagnosis not present

## 2018-01-23 DIAGNOSIS — R188 Other ascites: Principal | ICD-10-CM

## 2018-01-23 DIAGNOSIS — K7460 Unspecified cirrhosis of liver: Secondary | ICD-10-CM | POA: Diagnosis not present

## 2018-01-23 NOTE — Progress Notes (Signed)
Creatinine creeping up. Potassium is 5.4, likely secondary to declining renal function. Have him take the lasix 20 mg and aldactone 50 mg every other day. He may not be able to tolerate diuretic therapy and I wonder if evolving hepatorenal syndrome in setting of cirrhosis. Please have him recheck BMP on Friday. Please send referral to Nephrology, as he needs to be closely monitored and that's beyond our scope. Also, increasing ferritin, high sats, likely related to liver disease but we need to refer to Hematology. He may need MRI of liver in future but let's get him in to see Hematology first. Finally, no evidence of PVT on ultrasound. No evidence of HCC on ultrasound. However, in this scenario, I am concerned about his quick decompensation and may need more dedicated imaging soon.   Copying to Randall Hiss and Magda Paganini in my absence, as BMP will come back Friday.  In summary: 1. Refer to Nephrology, reason: declining renal function in setting of cirrhosis 2. Refer to Hematology, iron overload, HETEROZYGOUS FOR THE C282Y MUTATION 3. May need further dedicated liver imaging, to be determined 4. Take diuretics every other day. He may not be able to tolerate this at all and may have to move to serial LVAPs. 5. Keep 10/23 appt.

## 2018-01-24 ENCOUNTER — Other Ambulatory Visit: Payer: Self-pay

## 2018-01-24 DIAGNOSIS — N289 Disorder of kidney and ureter, unspecified: Secondary | ICD-10-CM

## 2018-01-26 DIAGNOSIS — K7460 Unspecified cirrhosis of liver: Secondary | ICD-10-CM | POA: Diagnosis not present

## 2018-01-26 DIAGNOSIS — R188 Other ascites: Secondary | ICD-10-CM | POA: Diagnosis not present

## 2018-01-26 LAB — BASIC METABOLIC PANEL
BUN/Creatinine Ratio: 18 (calc) (ref 6–22)
BUN: 30 mg/dL — ABNORMAL HIGH (ref 7–25)
CALCIUM: 8.4 mg/dL — AB (ref 8.6–10.3)
CO2: 25 mmol/L (ref 20–32)
Chloride: 98 mmol/L (ref 98–110)
Creat: 1.67 mg/dL — ABNORMAL HIGH (ref 0.70–1.11)
GLUCOSE: 100 mg/dL (ref 65–139)
Potassium: 4.6 mmol/L (ref 3.5–5.3)
Sodium: 131 mmol/L — ABNORMAL LOW (ref 135–146)

## 2018-01-30 NOTE — Progress Notes (Signed)
When is nephrology appt? Creatinine slightly increased but overall stable from a week ago. Sodium trending down. Is he taking diuretics every other day?

## 2018-01-31 ENCOUNTER — Inpatient Hospital Stay (HOSPITAL_COMMUNITY): Payer: Medicare HMO | Attending: Internal Medicine | Admitting: Internal Medicine

## 2018-01-31 ENCOUNTER — Encounter (HOSPITAL_COMMUNITY): Payer: Self-pay | Admitting: Internal Medicine

## 2018-01-31 ENCOUNTER — Other Ambulatory Visit: Payer: Self-pay

## 2018-01-31 DIAGNOSIS — Z79899 Other long term (current) drug therapy: Secondary | ICD-10-CM | POA: Diagnosis not present

## 2018-01-31 DIAGNOSIS — I10 Essential (primary) hypertension: Secondary | ICD-10-CM | POA: Diagnosis not present

## 2018-01-31 DIAGNOSIS — K7460 Unspecified cirrhosis of liver: Secondary | ICD-10-CM

## 2018-01-31 NOTE — Progress Notes (Signed)
Referring Physician:  [facility]  Diagnosis Hereditary hemochromatosis ([place]) - Plan: Phlebotomy therapeutic, CBC with Differential/Platelet, Comprehensive metabolic panel, Lactate dehydrogenase, Ferritin  Staging Cancer Staging No matching staging information was found for the patient.  Assessment and Plan:  1. Hemochromatosis.  82 year old male followed by [facility].  Pt had CT scan done 08/2017 that showed cirrhosis.  Hepatitis panel negative.  Iron studies were noted to be elevated with ferritin of 770 and % saturation of 92.  Pt underwent hemochromatosis gene evaluation on 11/07/2017 that showed he was heterozygous for C282Y gene.  He denies any family history of hemochromatosis or liver disorders.  Pt is seen today for consultation due to hemochromatosis.   Long talk held with pt and he was provided written information regarding his diagnosis.  It was noted he has elevated ferritin of > 700.  Imaging in 08/2017 showed findings consistent with cirrhosis.  He will be set up for 1 unit ( 500 ml) phlebotomy weekly for 2 weeks.  He will RTC in 02/2018 for follow-up and repeat labs after phlebotomy.  Goal is to decrease ferritin to less than 50.  All questions answered and he expressed understanding of the information presented.    2.  Cirrhosis.  This could be due to hemochromatosis.  He had Hepatitis panel checked and was negative.  He should continue to follow-up with GI.    3.  HTN.  BP is noted to be 148/71.  Follow-up with PCP.   Greater than 40 minutes spent with more than 50% spent in counseling and coordination of care.       HPI:  82 year old male followed by [facility].  Pt had CT scan done 08/2017 that showed cirrhosis.  Hepatitis panel negative.  Iron studies were noted to be elevated with ferritin of 770 and % saturation of 92.  Pt underwent hemochromatosis gene evaluation on 11/07/2017 that showed he was heterozygous for C282Y gene.  He denies any family history of hemochromatosis or liver  disorders.  Pt is seen today for consultation due to hemochromatosis.   Problem List Patient Active Problem List   Diagnosis Date Noted  . Constipation [K59.00] 01/15/2018  . Left inguinal hernia [K40.90]   . Scrotal edema [N50.89] 10/30/2017  . Cirrhosis ([place]) [K74.60] 09/13/2017    Past Medical History Past Medical History:  Diagnosis Date  . Cirrhosis ([place]) 08/2017  . HOH (hard of hearing)   . Hypertension     Past Surgical History Past Surgical History:  Procedure Laterality Date  . INGUINAL HERNIA REPAIR Left 12/22/2017   Procedure: HERNIA REPAIR LEFT INGUINAL WITH MESH;  Surgeon: Aviva Signs, MD;  Location: [facility];  Service: General;  Laterality: Left;  . none      Family History Family History  Problem Relation Age of Onset  . Liver cancer Neg Hx   . Cirrhosis Neg Hx   . Colon cancer Neg Hx      Social History  reports that he quit smoking about 34 years ago. His smoking use included cigarettes. He has a 40.00 pack-year smoking history. He has never used smokeless tobacco. He reports that he drank alcohol. He reports that he has current or past drug history.  Medications  Current Outpatient Medications:  .  furosemide (LASIX) 20 MG tablet, Take 1 tablet (20 mg total) by mouth daily., Disp: 30 tablet, Rfl: 3 .  ibuprofen (ADVIL,MOTRIN) 200 MG tablet, Take 200 mg by mouth every 6 (six) hours as needed (for  pain.). , Disp: , Rfl:  .  spironolactone (ALDACTONE) 50 MG tablet, Take 1 tablet (50 mg total) by mouth daily., Disp: 30 tablet, Rfl: 3  Current Facility-Administered Medications:  .  albumin human 25 % solution 25 g, 25 g, Intravenous, Once, Annitta Needs, NP  Allergies Patient has no known allergies.  Review of Systems Review of Systems - Oncology ROS negative other than abdominal distension.     Physical Exam  Vitals Wt Readings from Last 3 Encounters:  01/31/18 204 lb 4.8 oz (92.7 kg)  01/09/18 210 lb 3.2 oz (95.3 kg)  01/04/18 218 lb (98.9  kg)   Temp Readings from Last 3 Encounters:  01/31/18 98.2 F (36.8 C) (Oral)  01/16/18 98 F (36.7 C) (Oral)  01/10/18 98.2 F (36.8 C) (Oral)   BP Readings from Last 3 Encounters:  01/31/18 (!) 148/71  01/16/18 130/65  01/10/18 140/65   Pulse Readings from Last 3 Encounters:  01/31/18 90  01/16/18 70  01/10/18 78   Constitutional: Well-developed, well-nourished, and in no distress.   HENT: Head: Normocephalic and atraumatic.  Mouth/Throat: No oropharyngeal exudate. Mucosa moist. Eyes: Pupils are equal, round, and reactive to light. Conjunctivae are normal. No scleral icterus.  Neck: Normal range of motion. Neck supple. No JVD present.  Cardiovascular: Normal rate, regular rhythm and normal heart sounds.  Exam reveals no gallop and no friction rub.   No murmur heard. Pulmonary/Chest: Effort normal and breath sounds normal. No respiratory distress. No wheezes.No rales.  Abdominal: Soft. Bowel sounds are normal. Abdominal distension.  No fluid wave noted.   Musculoskeletal: No edema or tenderness.  Lymphadenopathy: No cervical, axillary or supraclavicular adenopathy.  Neurological: Alert and oriented to person, place, and time. No cranial nerve deficit.  Skin: Skin is warm and dry. No rash noted. No erythema. No pallor.  Psychiatric: Affect and judgment normal.   Labs No visits with results within 3 Day(s) from this visit.  Latest known visit with results is:  Orders Only on 01/23/2018  Component Date Value Ref Range Status  . Glucose, Bld 01/26/2018 100  65 - 139 mg/dL Final   Comment: .        Non-fasting reference interval .   . BUN 01/26/2018 30* 7 - 25 mg/dL Final  . Creat 01/26/2018 1.67* 0.70 - 1.11 mg/dL Final   Comment: For patients >6 years of age, the reference limit for Creatinine is approximately 13% higher for people identified as African-American. .   Havery Moros Ratio 01/26/2018 18  6 - 22 (calc) Final  . Sodium 01/26/2018 131* 135 - 146  mmol/L Final  . Potassium 01/26/2018 4.6  3.5 - 5.3 mmol/L Final  . Chloride 01/26/2018 98  98 - 110 mmol/L Final  . CO2 01/26/2018 25  20 - 32 mmol/L Final  . Calcium 01/26/2018 8.4* 8.6 - 10.3 mg/dL Final     Pathology Orders Placed This Encounter  Procedures  . Phlebotomy therapeutic    Standing Status:   Standing    Number of Occurrences:   2    Standing Expiration Date:   11-Mar-2018  . CBC with Differential/Platelet    Standing Status:   Future    Standing Expiration Date:   02/01/2019  . Comprehensive metabolic panel    Standing Status:   Future    Standing Expiration Date:   02/01/2019  . Lactate dehydrogenase    Standing Status:   Future    Standing Expiration Date:   02/01/2019  .  Ferritin    Standing Status:   Future    Standing Expiration Date:   02/01/2019       Zoila Shutter MD

## 2018-02-03 DIAGNOSIS — R609 Edema, unspecified: Secondary | ICD-10-CM | POA: Diagnosis not present

## 2018-02-03 DIAGNOSIS — R404 Transient alteration of awareness: Secondary | ICD-10-CM | POA: Diagnosis not present

## 2018-02-03 DIAGNOSIS — R1111 Vomiting without nausea: Secondary | ICD-10-CM | POA: Diagnosis not present

## 2018-02-05 ENCOUNTER — Telehealth: Payer: Self-pay

## 2018-02-05 NOTE — Telephone Encounter (Signed)
Death certificate signed.

## 2018-02-05 NOTE — Telephone Encounter (Signed)
T/C from [place] at [facility] [phone]). He said the pt died over the weekend, supposedly an aneurysm, expelled massive amounts of blood. EMS was called to his home, pt did not have a PCP, and they made efforts to reach our physicians but was unable to do so. When called the emergency number was sent back to office number and of course we were closed.  Rusty said that per the medical examiner, Wendie Simmer, since Roseanne Kaufman, NP had seen pt within the last year, it falls on her to sign the death certificate.  Aaron Edelman can be reached @ [phone] and would like for Vicente Males to call him to discuss.  They need to hear back right away.   Forwarding to Roseanne Kaufman, NP who is at the hospital now and I will also send her a text.

## 2018-02-05 NOTE — Telephone Encounter (Signed)
I spoke with Bobby Tyler. EMS crew was called to house over the weekend. The family had been trying to get Bobby Tyler to seek medical attention. He had been having black stools for several days prior to death. EMS found toilet tissue in trash cans with dark blood. He began vomiting large amounts of bright red blood over the weekend, along with tarry stools. Per Bobby Tyler, EMS reported this as "excessive". He was taken to the funeral home from there. He does not have any primary care provider. He was last seen by me on 01/09/18. He had been taking Ibuprofen and asked to stop. He was also declining, with need for paras and increasing MELD, worsening renal function, ascites. US abdomen 01/23/18 with patent portal vein. Plans were for endoscopy to be arranged in next 4-6 weeks for screening purposes.   Unable to definitely say, but it is most probably he suffered a variceal bleed, less likely ulcer related. The funeral home will be by on 10/15 around 130 with paperwork.

## 2018-02-14 ENCOUNTER — Ambulatory Visit: Payer: Medicare HMO | Admitting: Gastroenterology

## 2018-02-23 DIAGNOSIS — 419620001 Death: Secondary | SNOMED CT | POA: Diagnosis not present

## 2018-02-23 LAB — ACID FAST CULTURE WITH REFLEXED SENSITIVITIES (MYCOBACTERIA): Acid Fast Culture: NEGATIVE

## 2018-02-23 DEATH — deceased

## 2018-02-28 ENCOUNTER — Other Ambulatory Visit (HOSPITAL_COMMUNITY): Payer: Medicare HMO

## 2018-03-07 ENCOUNTER — Ambulatory Visit (HOSPITAL_COMMUNITY): Payer: Medicare HMO | Admitting: Internal Medicine

## 2018-08-09 ENCOUNTER — Encounter (HOSPITAL_COMMUNITY): Payer: Self-pay | Admitting: General Surgery

## 2019-05-23 IMAGING — CT CT CTA ABD/PEL W/CM AND/OR W/O CM
3 of 9 series · 12 of 46 positions shown, 18 images · IV contrast (iopamidol)
Comparison: CT abdomen pelvis 10/23/2015. Report only available
this time

ADDENDUM:
Kidney size has been requested in this addendum.

Right renal length 11.1 cm. Left renal length 10.8 cm. Normal renal
cortical thickness.
CLINICAL DATA: Bilateral leg swelling. Abdominal pain. Rule out
ischemia.
EXAM:
CTA ABDOMEN AND PELVIS without AND WITH CONTRAST
TECHNIQUE: Multidetector CT imaging of the abdomen and pelvis was performed
using the standard protocol during bolus administration of
intravenous contrast. Multiplanar reconstructed images and MIPs were
obtained and reviewed to evaluate the vascular anatomy.
CONTRAST:  100mL [id number] IOPAMIDOL ([id number]) INJECTION 76%

[Series 4: mesenteric axial arterial · axial · arterial · 0.85mm/px · z∈[+715,+815]mm · 3 of 251 slices shown]
[im 26/251  soft-tissue]
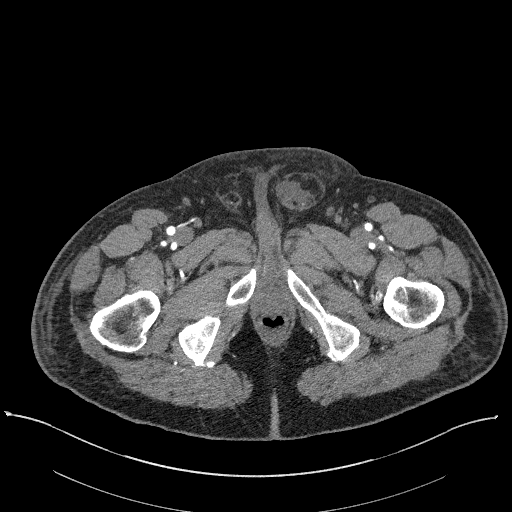
[im 51/251  soft-tissue]
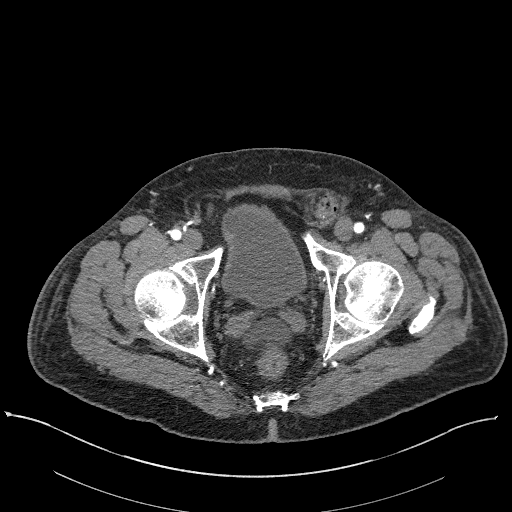
[im 76/251  soft-tissue]
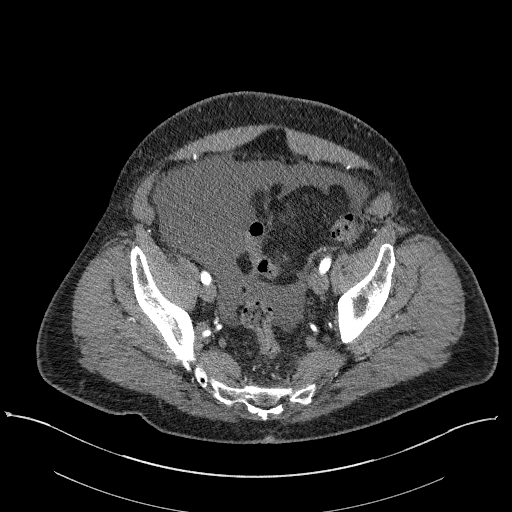

[Series 7: coronals · coronal · 0.99mm/px · 2 of 116 slices shown, 3 images]
[im 39/116  soft-tissue]
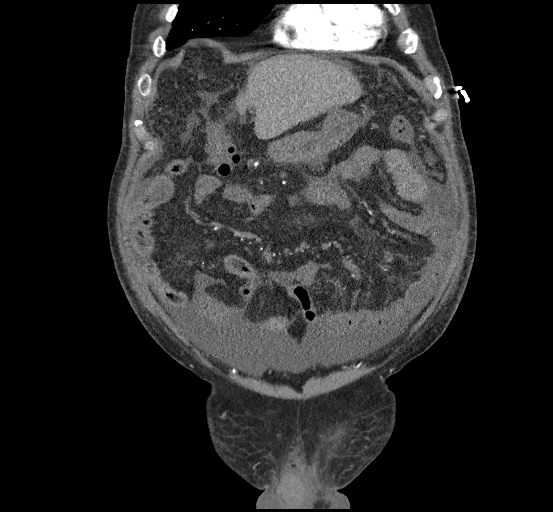
[im 39/116  bone]
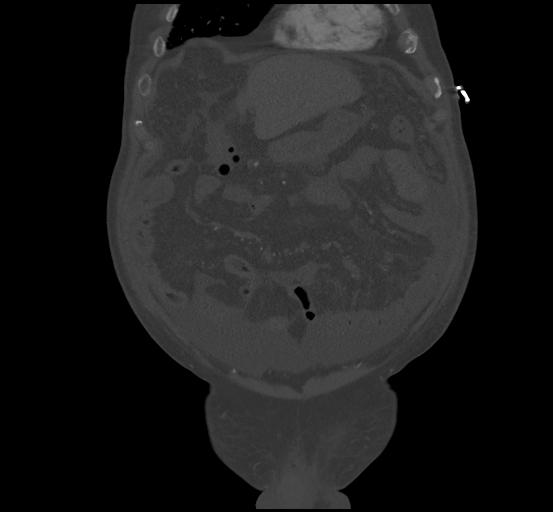
[im 77/116  soft-tissue]
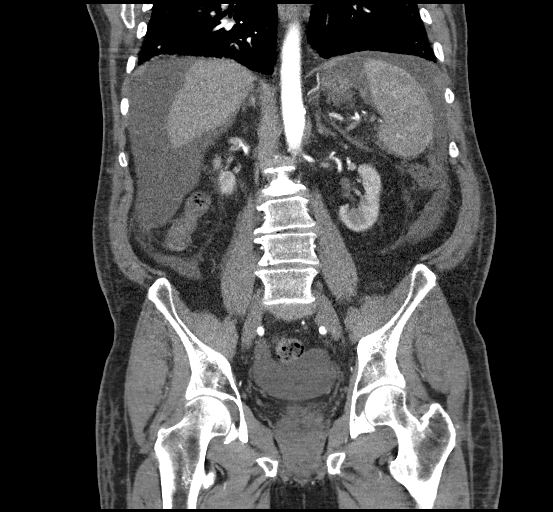

[Series 11: axial venous · axial · portal-venous · 0.83mm/px · z∈[+729,+1094]mm · 7 of 99 slices shown, 12 images]
[im 13/99  soft-tissue]
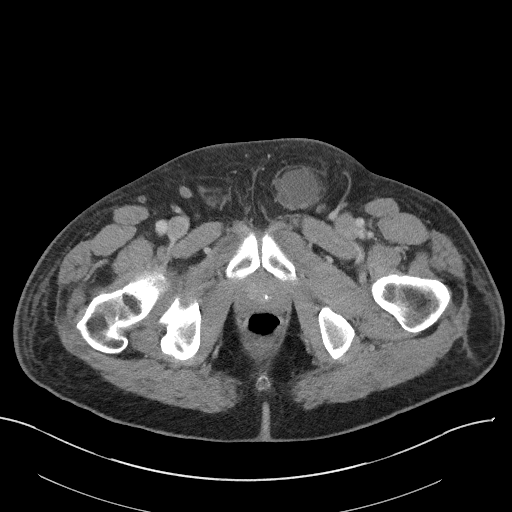
[im 13/99  bone]
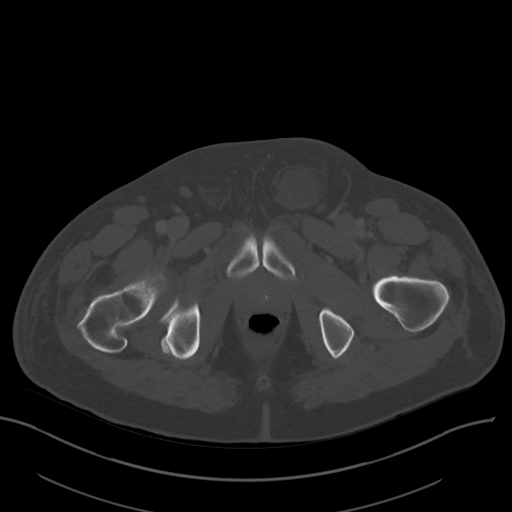
[im 25/99  soft-tissue]
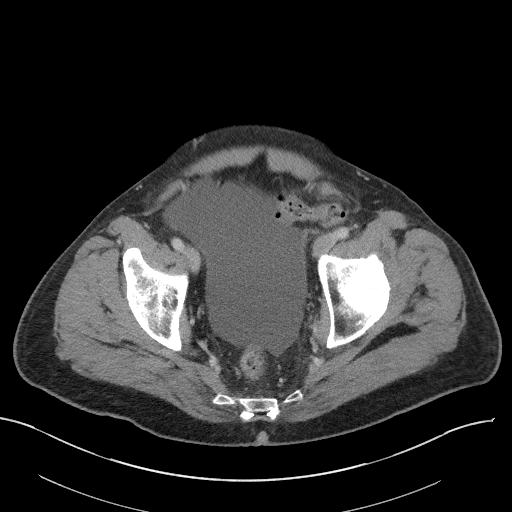
[im 37/99  soft-tissue]
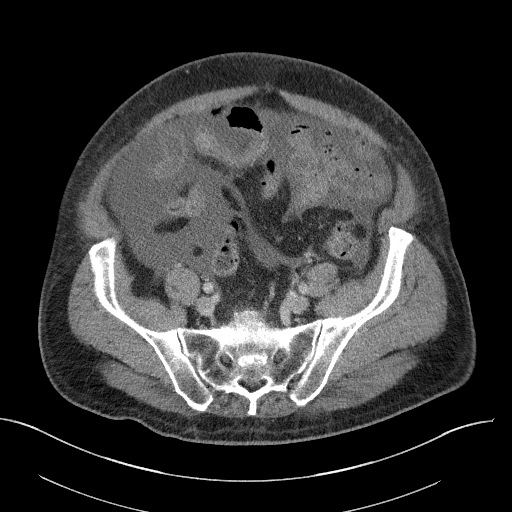
[im 50/99  soft-tissue]
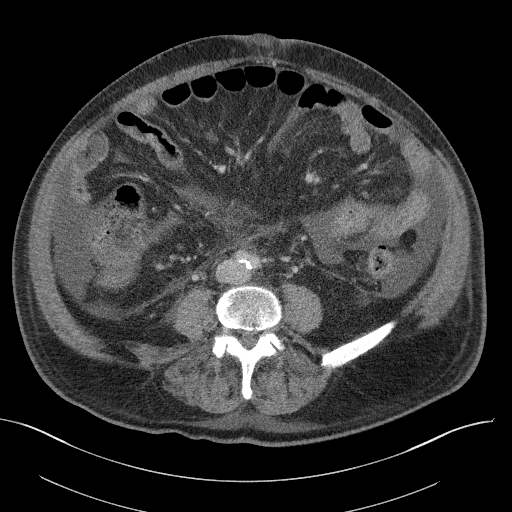
[im 50/99  lung]
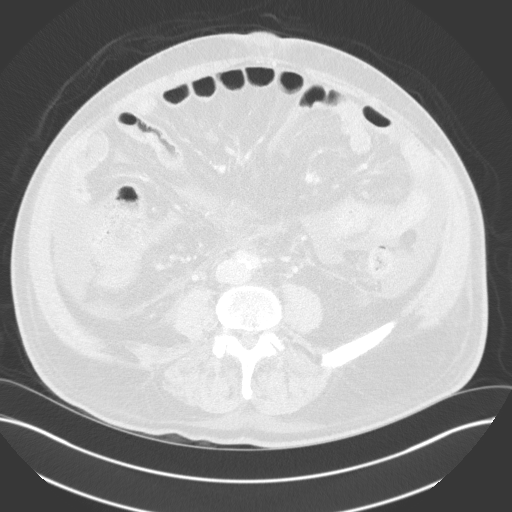
[im 62/99  soft-tissue]
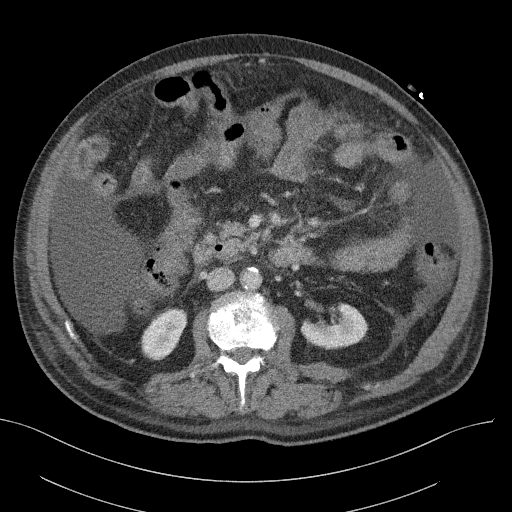
[im 62/99  lung]
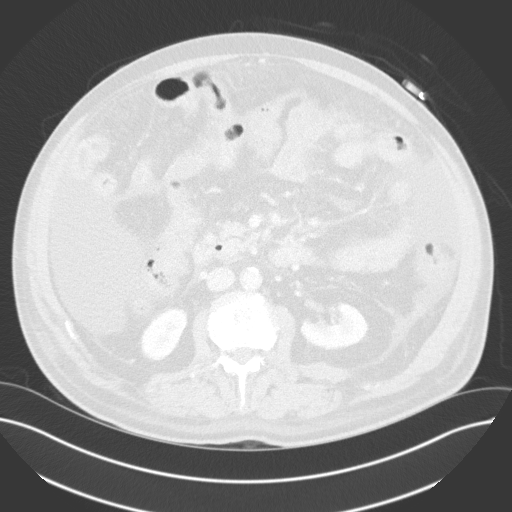
[im 74/99  soft-tissue]
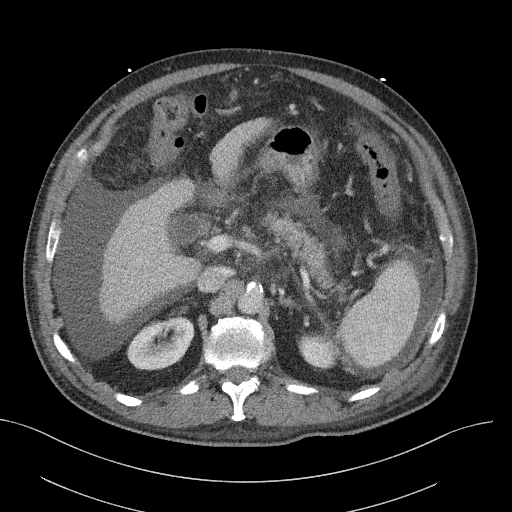
[im 74/99  lung]
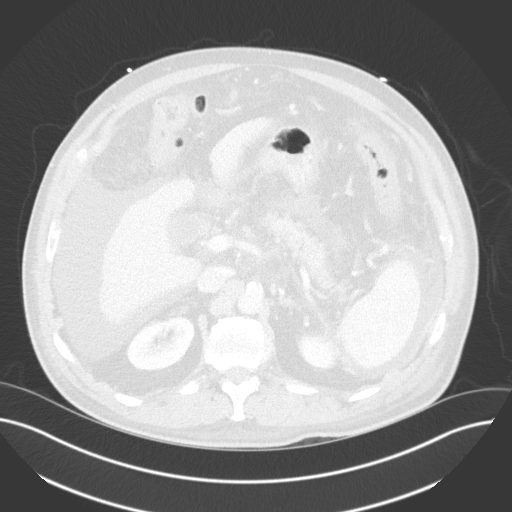
[im 86/99  soft-tissue]
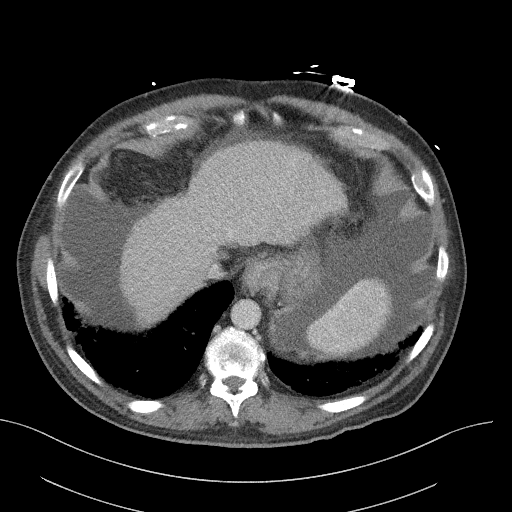
[im 86/99  lung]
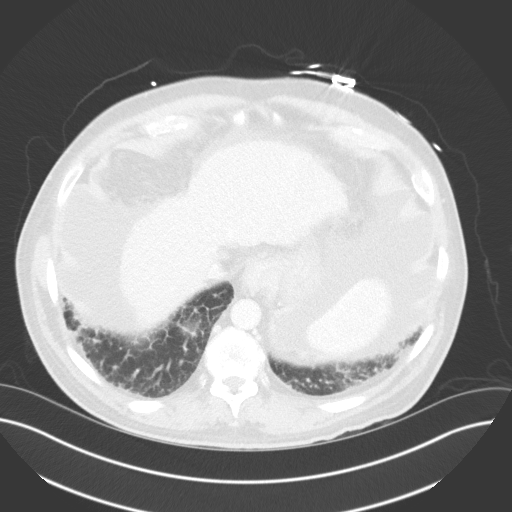

[12 of 46 positions shown; findings below may reference images not displayed]

FINDINGS: VASCULAR

Aorta: Mild atherosclerotic disease in the aorta without aneurysm.
Mild atherosclerotic disease in the iliac arteries bilaterally which
are widely patent.

Celiac: Moderate to severe stenosis at the origin

SMA: Mild stenosis at the origin of the SMA with diffuse
atherosclerotic calcification

Renals: Single renal artery bilaterally widely patent with mild
atherosclerotic disease.

IMA: Patent

Inflow: Normal

Proximal Outflow: Normal

Veins: Cirrhosis of the liver. Portal vein is patent. Superior
mesenteric vein is patent. Renal veins are patent bilaterally. Mild
paraesophageal varices..

Review of the MIP images confirms the above findings.

NON-VASCULAR

Lower chest: Negative for infiltrate or effusion

Hepatobiliary: Advanced cirrhosis of liver with small liver with
nodular contour. Left lobe is hypertrophied. Large amount of ascites
throughout the abdomen and pelvis

Pancreas: Negative

Spleen: Normal splenic size

Adrenals/Urinary Tract: Negative for renal mass or obstruction. No
renal calculi. Urinary bladder normal.

Stomach/Bowel: Negative for bowel obstruction. No bowel edema or
mass.

Lymphatic: Negative for lymphadenopathy

Reproductive: Mild prostate enlargement

Other: Left inguinal hernia containing a loop of sigmoid colon and
fluid. Negative for bowel edema in the hernia sac.

Musculoskeletal: No acute skeletal abnormality.
IMPRESSION: VASCULAR

Moderate to severe stenosis origin of celiac artery. Mild stenosis
of the SMA. No evidence of bowel ischemia. Negative for venous
thrombosis.

Mild atherosclerotic disease in the aorta and iliac arteries without
significant stenosis

NON-VASCULAR

*Advanced cirrhosis with large amount of ascites.
*Left inguinal hernia containing sigmoid colon without evidence of
bowel edema.
*Cholelithiasis

## 2019-05-23 IMAGING — DX DG CHEST 2V
2 series · 2 of 2 positions shown · non-contrast
Comparison: None.

CLINICAL DATA: Abdominal and lower extremity swelling.

EXAM:
CHEST - 2 VIEW

[chest pa]
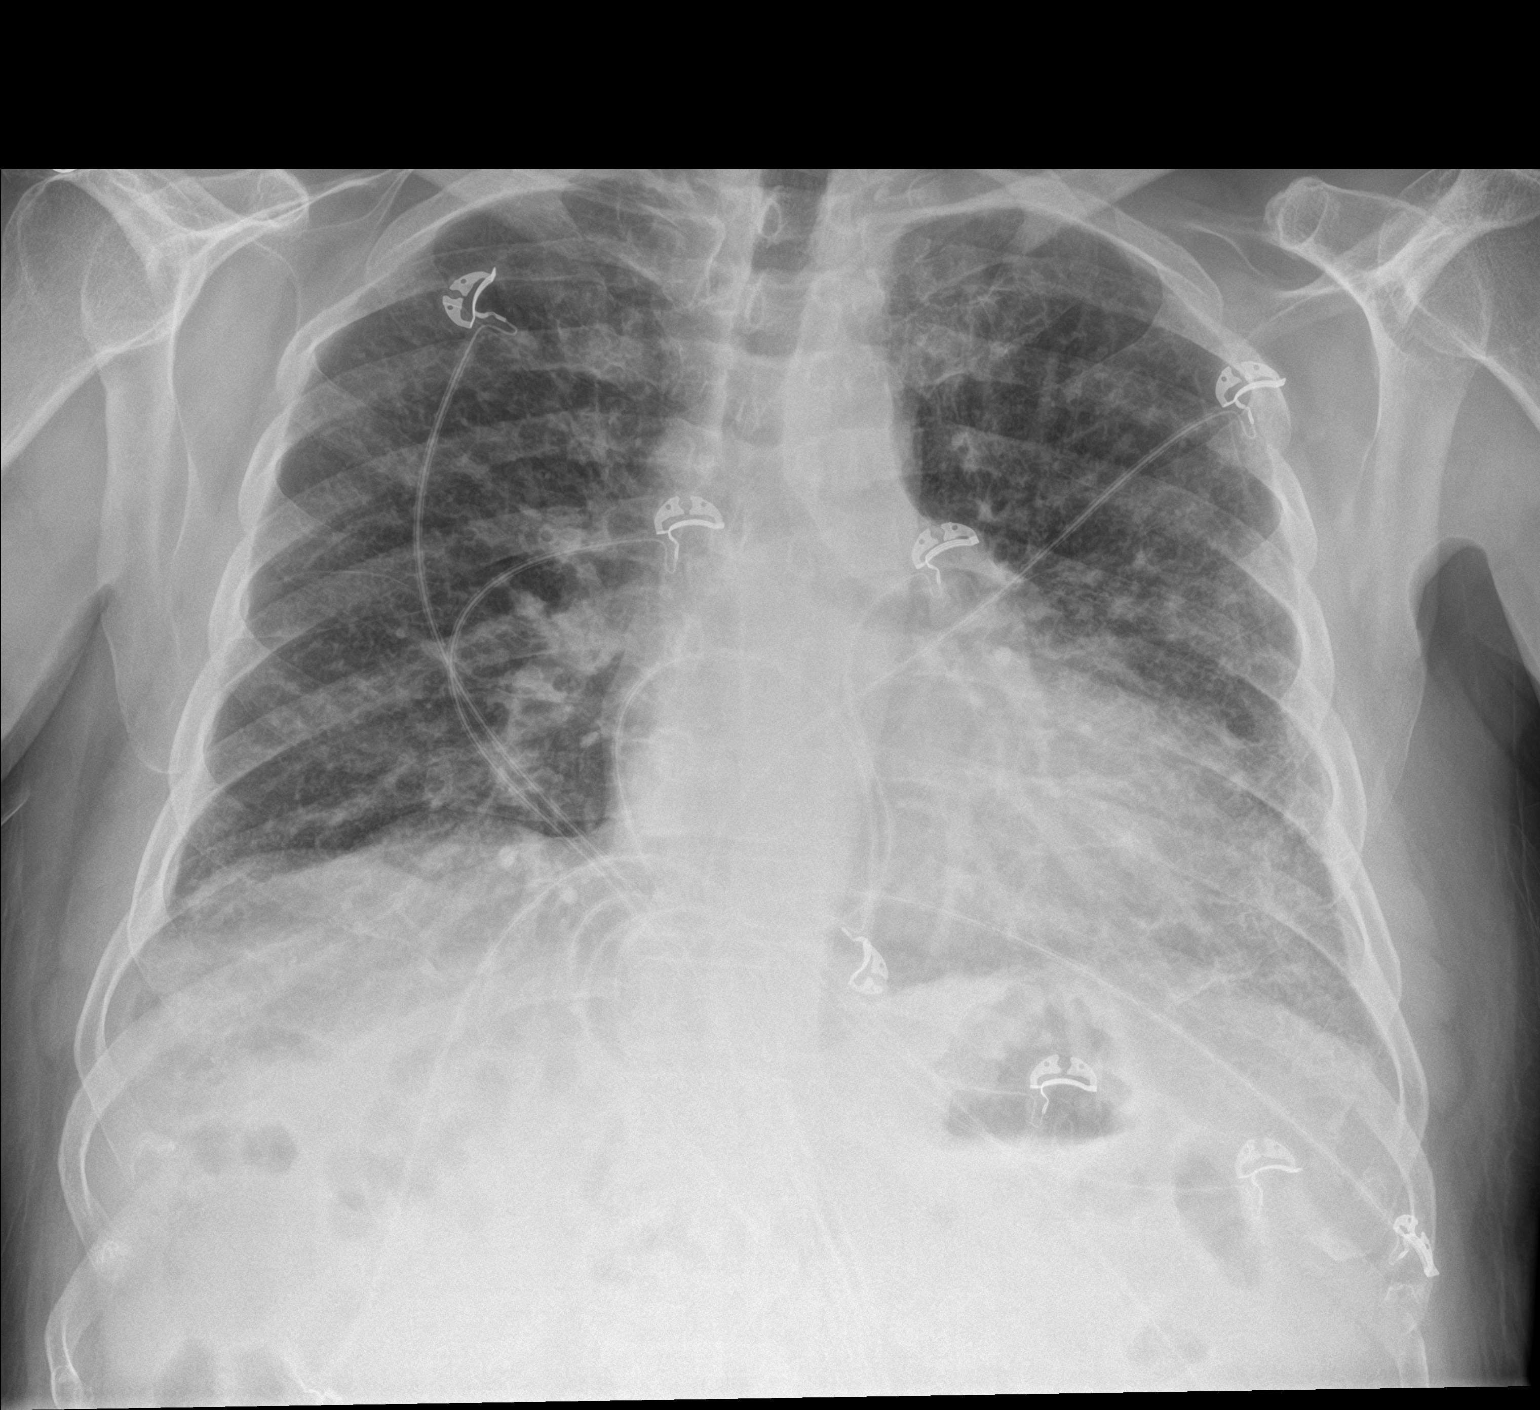

[chest lat]
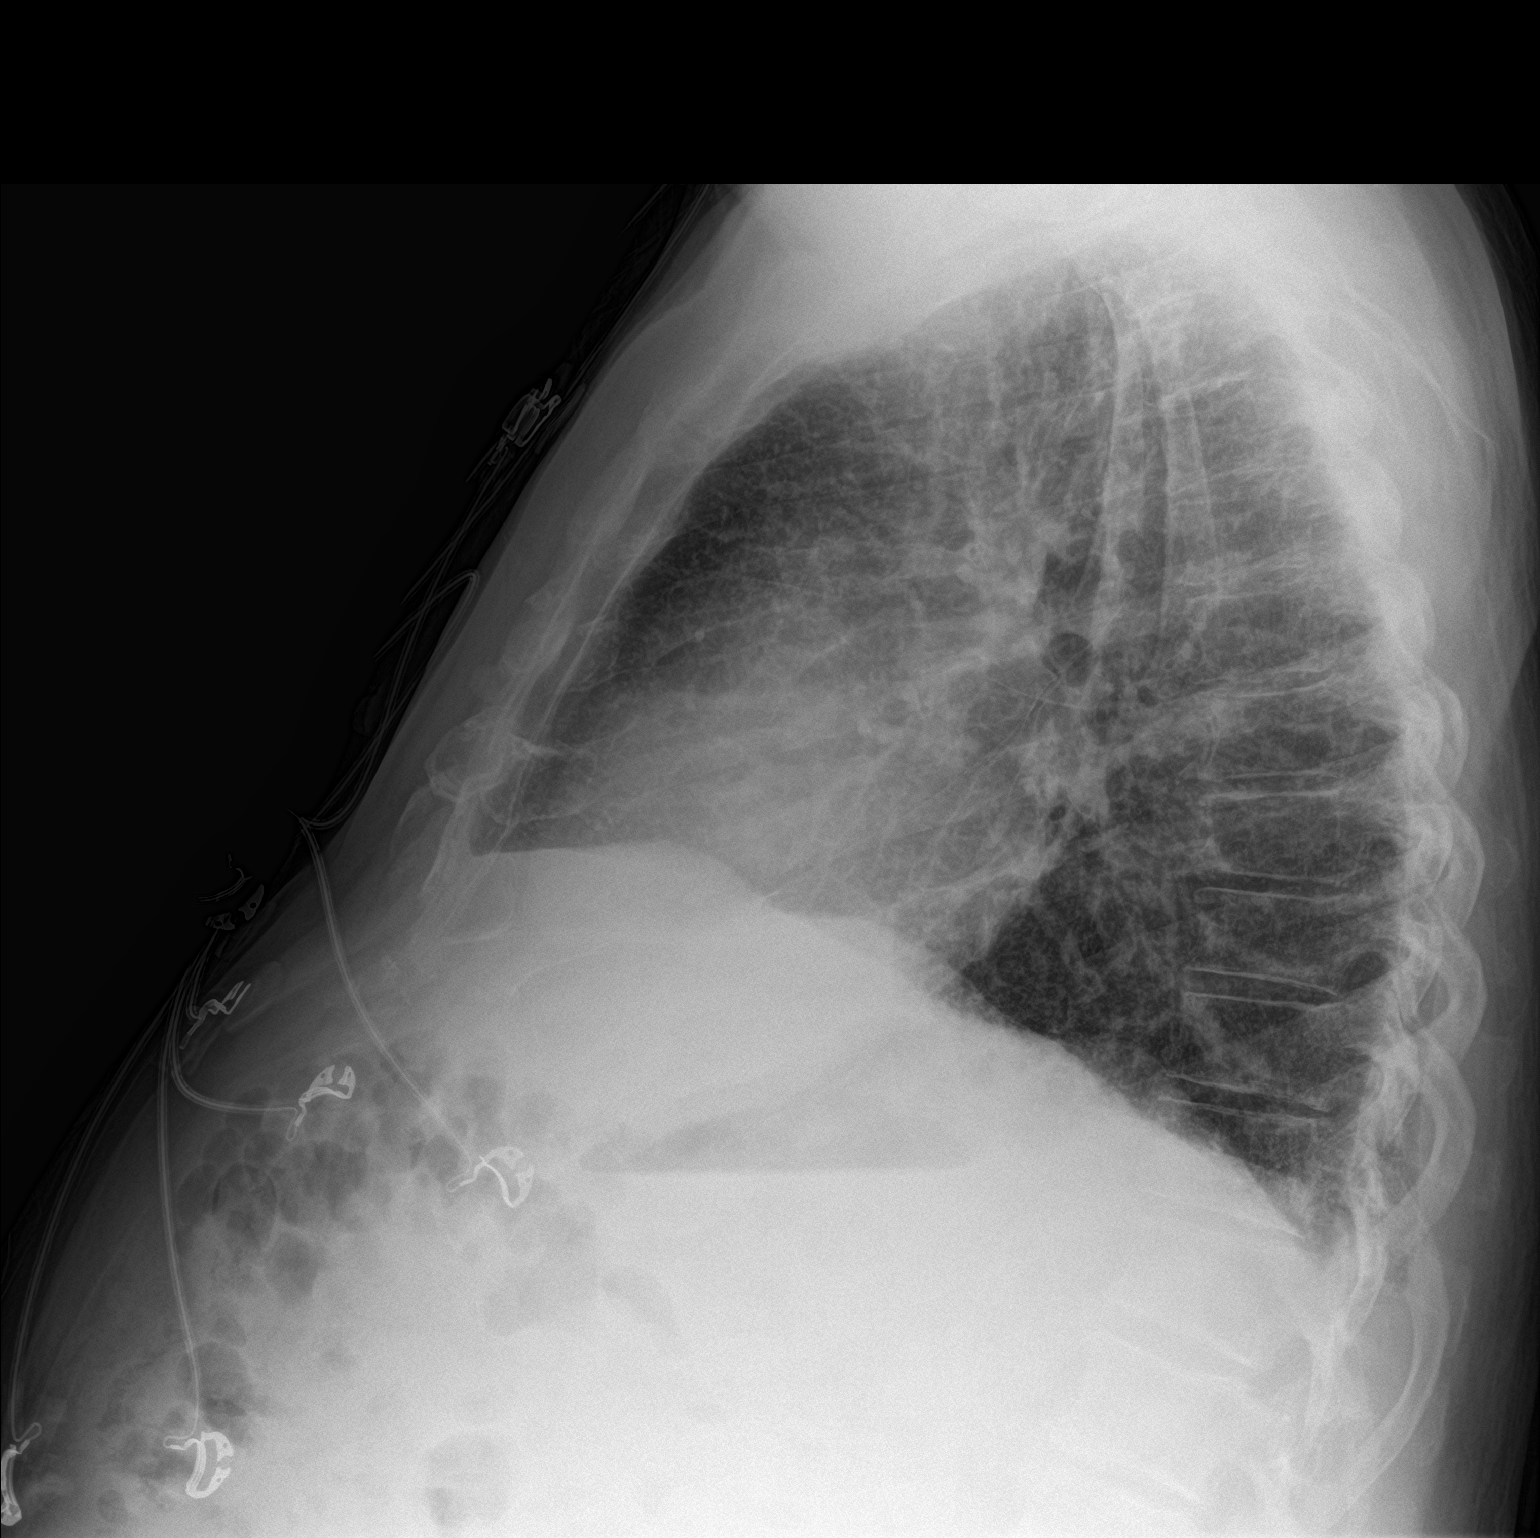

[2 of 2 positions shown; findings below may reference images not displayed]

FINDINGS: UPPER limits normal heart size noted.

Interstitial opacities bilaterally are of uncertain chronicity.

There is no evidence of focal airspace disease, suspicious pulmonary
nodule/mass, pleural effusion, or pneumothorax.

No acute bony abnormalities are identified.
IMPRESSION: Nonspecific bilateral interstitial opacities-suspect chronic.

UPPER limits normal heart size.
